# Patient Record
Sex: Female | Born: 1935 | Race: White | Hispanic: No | Smoking: Current some day smoker
Health system: Southern US, Community
[De-identification: ages and names within clinical notes are randomized; demographics above are authoritative.]

## PROBLEM LIST (undated history)

## (undated) DIAGNOSIS — E119 Type 2 diabetes mellitus without complications: Secondary | ICD-10-CM

## (undated) DIAGNOSIS — I5022 Chronic systolic (congestive) heart failure: Secondary | ICD-10-CM

## (undated) DIAGNOSIS — J449 Chronic obstructive pulmonary disease, unspecified: Secondary | ICD-10-CM

## (undated) DIAGNOSIS — I10 Essential (primary) hypertension: Secondary | ICD-10-CM

## (undated) DIAGNOSIS — E785 Hyperlipidemia, unspecified: Secondary | ICD-10-CM

## (undated) DIAGNOSIS — I4891 Unspecified atrial fibrillation: Secondary | ICD-10-CM

## (undated) DIAGNOSIS — Z8673 Personal history of transient ischemic attack (TIA), and cerebral infarction without residual deficits: Secondary | ICD-10-CM

## (undated) DIAGNOSIS — F0390 Unspecified dementia without behavioral disturbance: Secondary | ICD-10-CM

---

## 2001-03-23 ENCOUNTER — Encounter: Payer: Self-pay | Admitting: *Deleted

## 2001-03-23 ENCOUNTER — Ambulatory Visit (HOSPITAL_COMMUNITY): Admission: RE | Admit: 2001-03-23 | Discharge: 2001-03-23 | Payer: Self-pay | Admitting: *Deleted

## 2002-11-03 ENCOUNTER — Encounter: Payer: Self-pay | Admitting: Orthopaedic Surgery

## 2002-11-08 ENCOUNTER — Inpatient Hospital Stay (HOSPITAL_COMMUNITY): Admission: RE | Admit: 2002-11-08 | Discharge: 2002-11-10 | Payer: Self-pay | Admitting: Orthopaedic Surgery

## 2002-11-08 ENCOUNTER — Encounter: Payer: Self-pay | Admitting: Orthopaedic Surgery

## 2002-11-10 ENCOUNTER — Inpatient Hospital Stay (HOSPITAL_COMMUNITY)
Admission: RE | Admit: 2002-11-10 | Discharge: 2002-11-15 | Payer: Self-pay | Admitting: Physical Medicine & Rehabilitation

## 2003-12-10 ENCOUNTER — Encounter: Admission: RE | Admit: 2003-12-10 | Discharge: 2003-12-10 | Payer: Self-pay | Admitting: Orthopaedic Surgery

## 2004-02-14 ENCOUNTER — Ambulatory Visit: Payer: Self-pay | Admitting: Physical Medicine & Rehabilitation

## 2004-02-14 ENCOUNTER — Inpatient Hospital Stay (HOSPITAL_COMMUNITY): Admission: RE | Admit: 2004-02-14 | Discharge: 2004-02-18 | Payer: Self-pay | Admitting: Orthopaedic Surgery

## 2004-02-18 ENCOUNTER — Inpatient Hospital Stay
Admission: RE | Admit: 2004-02-18 | Discharge: 2004-02-22 | Payer: Self-pay | Admitting: Physical Medicine & Rehabilitation

## 2004-04-10 ENCOUNTER — Emergency Department (HOSPITAL_COMMUNITY): Admission: EM | Admit: 2004-04-10 | Discharge: 2004-04-10 | Payer: Self-pay | Admitting: Emergency Medicine

## 2004-08-07 ENCOUNTER — Ambulatory Visit (HOSPITAL_BASED_OUTPATIENT_CLINIC_OR_DEPARTMENT_OTHER): Admission: RE | Admit: 2004-08-07 | Discharge: 2004-08-07 | Payer: Self-pay | Admitting: General Surgery

## 2004-08-07 ENCOUNTER — Ambulatory Visit (HOSPITAL_COMMUNITY): Admission: RE | Admit: 2004-08-07 | Discharge: 2004-08-07 | Payer: Self-pay | Admitting: General Surgery

## 2006-01-22 ENCOUNTER — Ambulatory Visit (HOSPITAL_COMMUNITY): Admission: RE | Admit: 2006-01-22 | Discharge: 2006-01-22 | Payer: Self-pay | Admitting: Specialist

## 2006-01-31 ENCOUNTER — Ambulatory Visit: Payer: Self-pay | Admitting: Hospitalist

## 2006-01-31 ENCOUNTER — Observation Stay (HOSPITAL_COMMUNITY): Admission: EM | Admit: 2006-01-31 | Discharge: 2006-02-02 | Payer: Self-pay | Admitting: Emergency Medicine

## 2006-05-28 ENCOUNTER — Inpatient Hospital Stay (HOSPITAL_COMMUNITY): Admission: RE | Admit: 2006-05-28 | Discharge: 2006-06-02 | Payer: Self-pay | Admitting: Specialist

## 2006-05-31 ENCOUNTER — Ambulatory Visit: Payer: Self-pay | Admitting: Vascular Surgery

## 2006-05-31 ENCOUNTER — Ambulatory Visit: Payer: Self-pay | Admitting: Physical Medicine & Rehabilitation

## 2006-06-11 ENCOUNTER — Ambulatory Visit: Payer: Self-pay | Admitting: Vascular Surgery

## 2006-06-11 ENCOUNTER — Ambulatory Visit (HOSPITAL_COMMUNITY): Admission: RE | Admit: 2006-06-11 | Discharge: 2006-06-11 | Payer: Self-pay | Admitting: Specialist

## 2008-08-20 IMAGING — US US ABDOMEN COMPLETE
1 series · 14 of 25 positions shown · non-contrast
Comparison: none

CLINICAL DATA: Epigastric pain. Question AAA. 
 COMPLETE ABDOMINAL ULTRASOUND:

[Series 1: unknown · 0.33mm/px · 14 of 51 slices shown]
[im 1/51]
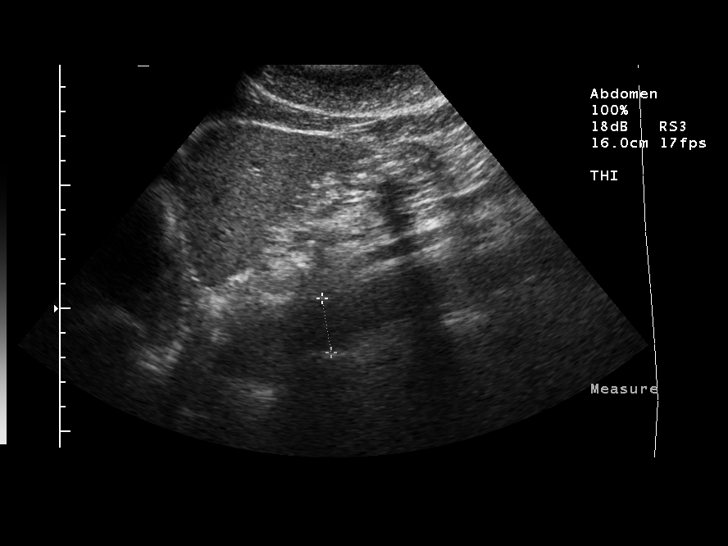
[im 5/51]
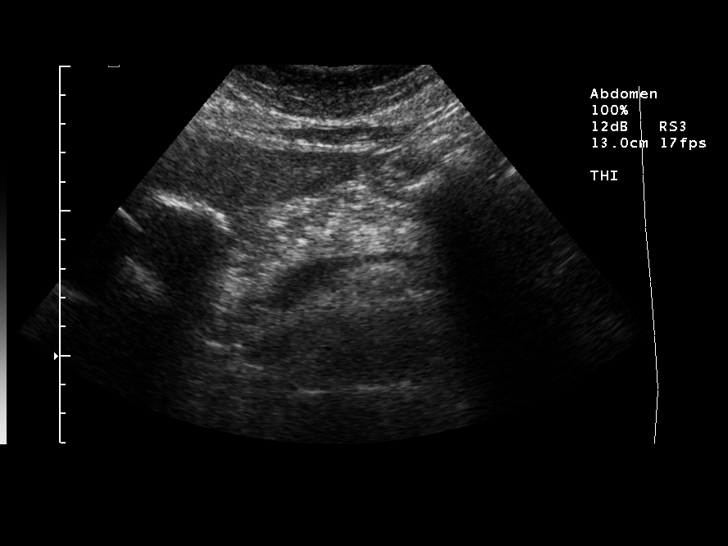
[im 9/51]
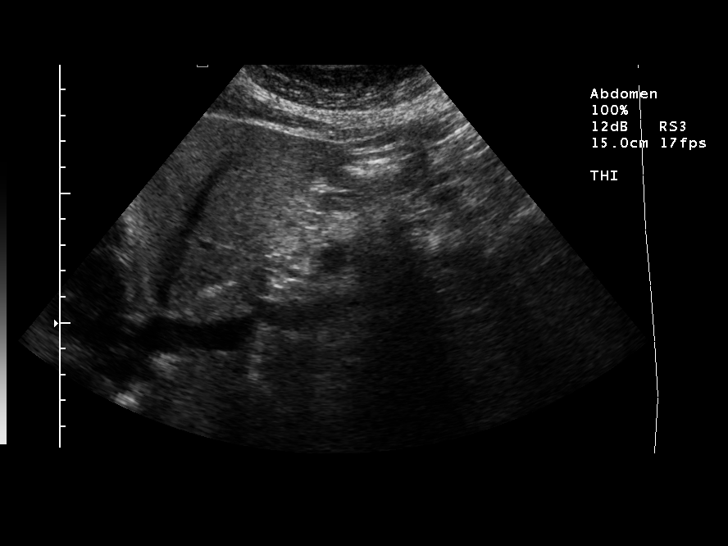
[im 13/51]
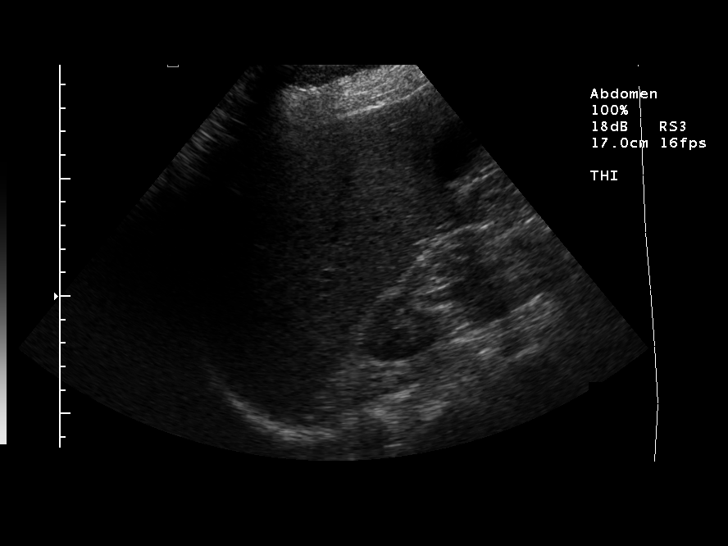
[im 17/51]
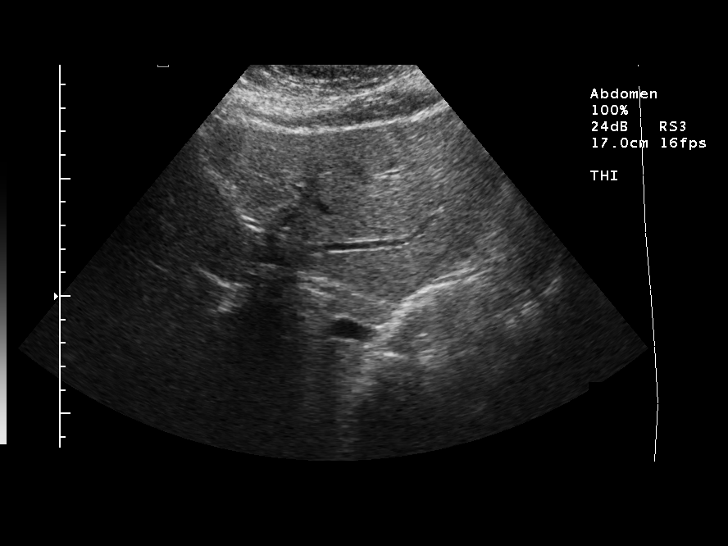
[im 19/51]
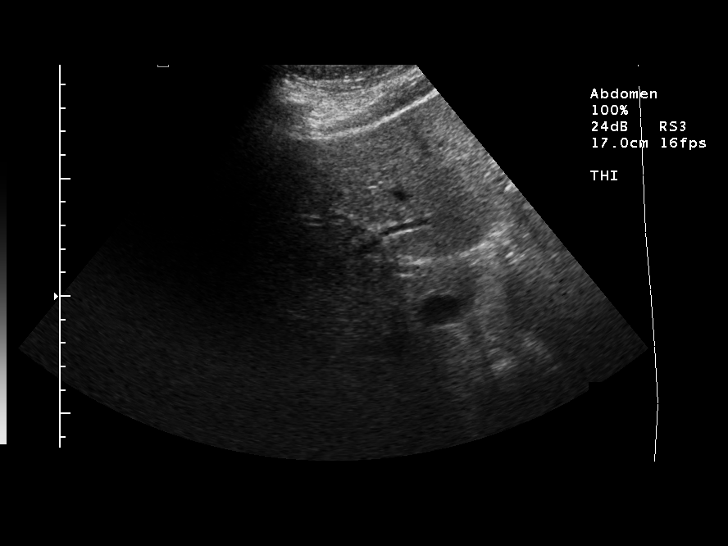
[im 23/51]
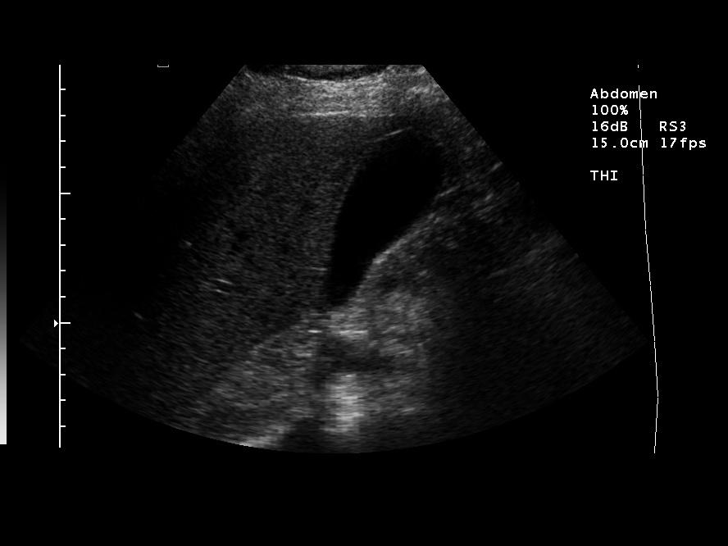
[im 28/51]
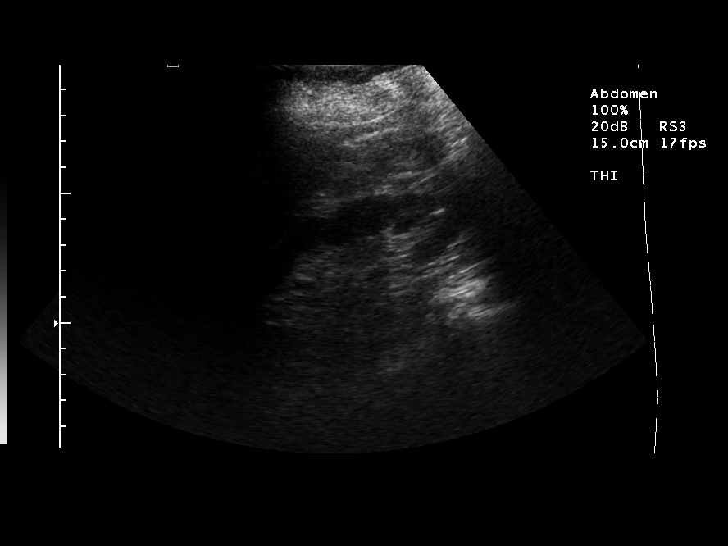
[im 32/51]
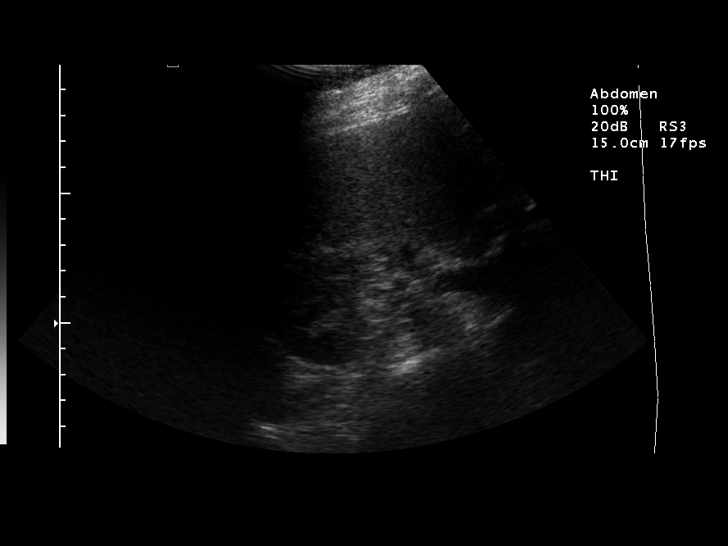
[im 34/51]
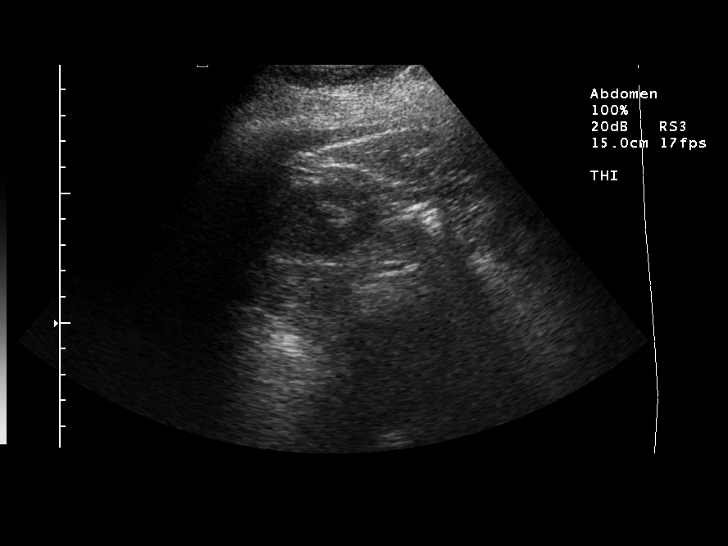
[im 38/51]
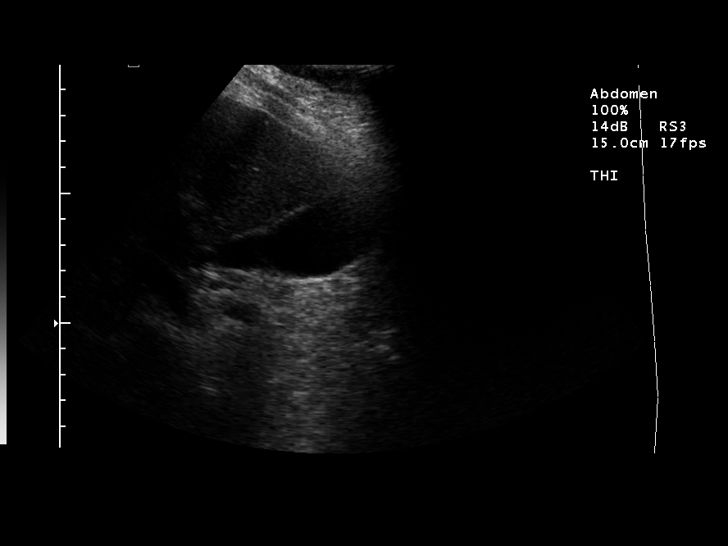
[im 42/51]
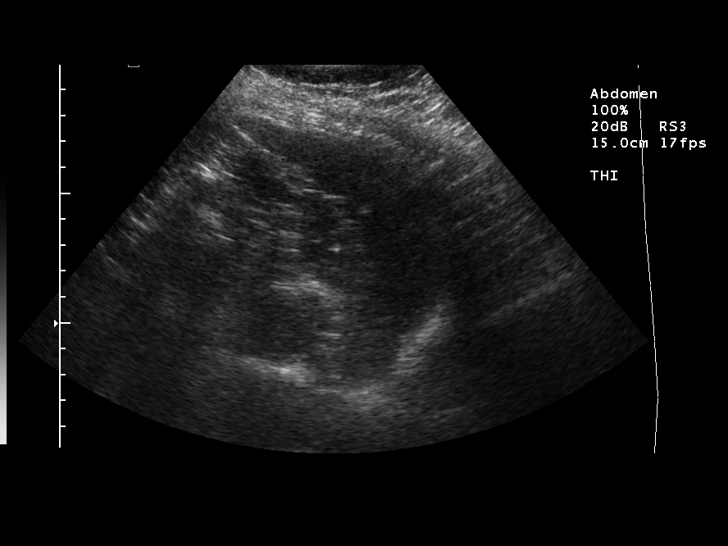
[im 46/51]
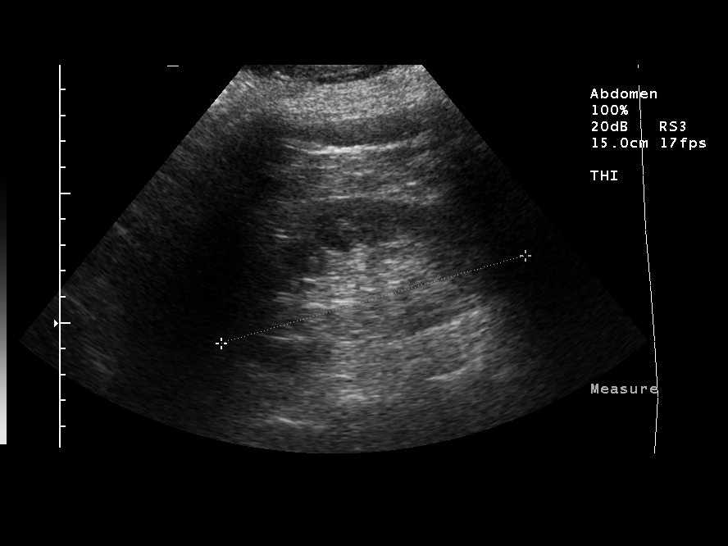
[im 51/51]
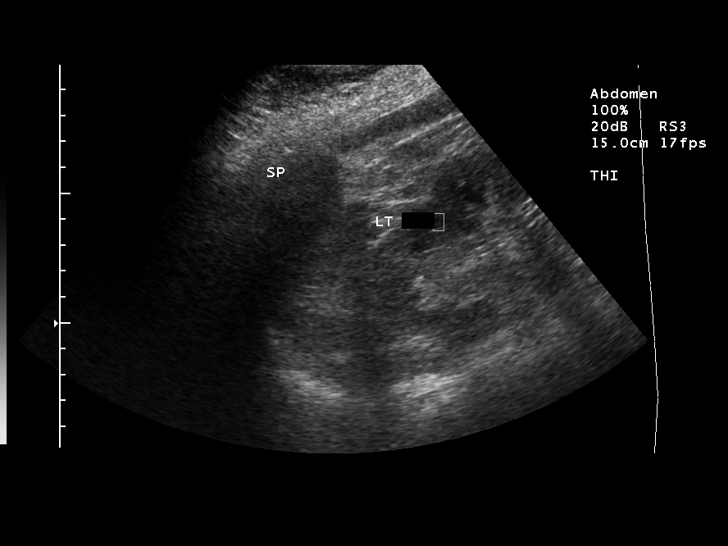

[14 of 25 positions shown; findings below may reference images not displayed]

FINDINGS: Normal gallbladder. Gallbladder wall thickness 1.3 mm.  No gallstones. The common duct measures 4.4 mm, which is well within normal limits. No hepatic, pancreatic, or splenic abnormalities. The spleen measures 8.7 cm in length. The right and left kidneys measure 13.2 and 12.2 cm in length, respectively.  The abdominal aorta measures 2.2 cm in maximum transverse diameter. No abdominal aortic aneurysm. Patent IVC.
IMPRESSION: No acute abdominal process. Normal gallbladder. Negative for AAA.

## 2012-01-02 ENCOUNTER — Encounter (HOSPITAL_COMMUNITY): Payer: Self-pay

## 2012-01-02 ENCOUNTER — Emergency Department (HOSPITAL_COMMUNITY): Payer: Medicare Other

## 2012-01-02 ENCOUNTER — Inpatient Hospital Stay (HOSPITAL_COMMUNITY)
Admission: EM | Admit: 2012-01-02 | Discharge: 2012-01-07 | DRG: 308 | Disposition: A | Discharge status: Skilled Nursing Facility | Payer: Medicare Other | Attending: Cardiology | Admitting: Cardiology

## 2012-01-02 DIAGNOSIS — R262 Difficulty in walking, not elsewhere classified: Secondary | ICD-10-CM

## 2012-01-02 DIAGNOSIS — Z8673 Personal history of transient ischemic attack (TIA), and cerebral infarction without residual deficits: Secondary | ICD-10-CM

## 2012-01-02 DIAGNOSIS — I4891 Unspecified atrial fibrillation: Principal | ICD-10-CM

## 2012-01-02 DIAGNOSIS — Z7982 Long term (current) use of aspirin: Secondary | ICD-10-CM

## 2012-01-02 DIAGNOSIS — J4489 Other specified chronic obstructive pulmonary disease: Secondary | ICD-10-CM | POA: Diagnosis present

## 2012-01-02 DIAGNOSIS — Z515 Encounter for palliative care: Secondary | ICD-10-CM

## 2012-01-02 DIAGNOSIS — Z66 Do not resuscitate: Secondary | ICD-10-CM | POA: Diagnosis present

## 2012-01-02 DIAGNOSIS — Z79899 Other long term (current) drug therapy: Secondary | ICD-10-CM

## 2012-01-02 DIAGNOSIS — I5043 Acute on chronic combined systolic (congestive) and diastolic (congestive) heart failure: Secondary | ICD-10-CM

## 2012-01-02 DIAGNOSIS — I672 Cerebral atherosclerosis: Secondary | ICD-10-CM | POA: Diagnosis present

## 2012-01-02 DIAGNOSIS — F0150 Vascular dementia without behavioral disturbance: Secondary | ICD-10-CM | POA: Diagnosis present

## 2012-01-02 DIAGNOSIS — M79673 Pain in unspecified foot: Secondary | ICD-10-CM

## 2012-01-02 DIAGNOSIS — J449 Chronic obstructive pulmonary disease, unspecified: Secondary | ICD-10-CM | POA: Diagnosis present

## 2012-01-02 DIAGNOSIS — E876 Hypokalemia: Secondary | ICD-10-CM | POA: Diagnosis present

## 2012-01-02 DIAGNOSIS — E785 Hyperlipidemia, unspecified: Secondary | ICD-10-CM

## 2012-01-02 DIAGNOSIS — R5381 Other malaise: Secondary | ICD-10-CM | POA: Diagnosis present

## 2012-01-02 DIAGNOSIS — E119 Type 2 diabetes mellitus without complications: Secondary | ICD-10-CM

## 2012-01-02 DIAGNOSIS — F0390 Unspecified dementia without behavioral disturbance: Secondary | ICD-10-CM

## 2012-01-02 DIAGNOSIS — Z9181 History of falling: Secondary | ICD-10-CM

## 2012-01-02 DIAGNOSIS — R296 Repeated falls: Secondary | ICD-10-CM

## 2012-01-02 DIAGNOSIS — I509 Heart failure, unspecified: Secondary | ICD-10-CM | POA: Diagnosis present

## 2012-01-02 DIAGNOSIS — I428 Other cardiomyopathies: Secondary | ICD-10-CM | POA: Diagnosis present

## 2012-01-02 DIAGNOSIS — E669 Obesity, unspecified: Secondary | ICD-10-CM | POA: Diagnosis present

## 2012-01-02 DIAGNOSIS — Z794 Long term (current) use of insulin: Secondary | ICD-10-CM

## 2012-01-02 DIAGNOSIS — I10 Essential (primary) hypertension: Secondary | ICD-10-CM

## 2012-01-02 DIAGNOSIS — I214 Non-ST elevation (NSTEMI) myocardial infarction: Secondary | ICD-10-CM

## 2012-01-02 HISTORY — DX: Essential (primary) hypertension: I10

## 2012-01-02 HISTORY — DX: Type 2 diabetes mellitus without complications: E11.9

## 2012-01-02 HISTORY — DX: Chronic obstructive pulmonary disease, unspecified: J44.9

## 2012-01-02 HISTORY — DX: Personal history of transient ischemic attack (TIA), and cerebral infarction without residual deficits: Z86.73

## 2012-01-02 HISTORY — DX: Chronic systolic (congestive) heart failure: I50.22

## 2012-01-02 HISTORY — DX: Unspecified dementia, unspecified severity, without behavioral disturbance, psychotic disturbance, mood disturbance, and anxiety: F03.90

## 2012-01-02 HISTORY — DX: Hyperlipidemia, unspecified: E78.5

## 2012-01-02 HISTORY — DX: Unspecified atrial fibrillation: I48.91

## 2012-01-02 LAB — POCT I-STAT, CHEM 8
Calcium, Ion: 1.14 mmol/L (ref 1.13–1.30)
Chloride: 103 mEq/L (ref 96–112)
Glucose, Bld: 135 mg/dL — ABNORMAL HIGH (ref 70–99)
HCT: 45 % (ref 36.0–46.0)
Potassium: 3.2 mEq/L — ABNORMAL LOW (ref 3.5–5.1)
Sodium: 140 mEq/L (ref 135–145)
TCO2: 23 mmol/L (ref 0–100)

## 2012-01-02 LAB — POCT I-STAT TROPONIN I

## 2012-01-02 LAB — CBC WITH DIFFERENTIAL/PLATELET
Basophils Absolute: 0 10*3/uL (ref 0.0–0.1)
Basophils Relative: 0 % (ref 0–1)
HCT: 42.5 % (ref 36.0–46.0)
Hemoglobin: 14.6 g/dL (ref 12.0–15.0)
Lymphocytes Relative: 22 % (ref 12–46)
MCH: 28.9 pg (ref 26.0–34.0)
MCV: 84 fL (ref 78.0–100.0)
Monocytes Relative: 15 % — ABNORMAL HIGH (ref 3–12)
Neutro Abs: 5.9 10*3/uL (ref 1.7–7.7)
Neutrophils Relative %: 62 % (ref 43–77)
Platelets: 261 10*3/uL (ref 150–400)
RDW: 14.5 % (ref 11.5–15.5)
WBC: 9.6 10*3/uL (ref 4.0–10.5)

## 2012-01-02 LAB — TROPONIN I: Troponin I: <0.3 ng/mL (ref <0.30)

## 2012-01-02 LAB — BASIC METABOLIC PANEL
BUN: 11 mg/dL (ref 6–23)
GFR calc Af Amer: >90 mL/min (ref >90)

## 2012-01-02 LAB — URINALYSIS, ROUTINE W REFLEX MICROSCOPIC
Bilirubin Urine: NEGATIVE
Ketones, ur: NEGATIVE mg/dL
Nitrite: NEGATIVE
pH: 6.5 (ref 5.0–8.0)

## 2012-01-02 LAB — URINE MICROSCOPIC-ADD ON

## 2012-01-02 LAB — HEPATIC FUNCTION PANEL
ALT: 23 U/L (ref 0–35)
Bilirubin, Direct: 0.3 mg/dL (ref 0.0–0.3)
Indirect Bilirubin: 1 mg/dL — ABNORMAL HIGH (ref 0.3–0.9)

## 2012-01-02 LAB — HEPARIN LEVEL (UNFRACTIONATED): Heparin Unfractionated: 0.32 IU/mL (ref 0.30–0.70)

## 2012-01-02 LAB — APTT: aPTT: 33 seconds (ref 24–37)

## 2012-01-02 LAB — GLUCOSE, CAPILLARY

## 2012-01-02 MED ORDER — ONDANSETRON HCL 4 MG/2ML IJ SOLN: 4.0000 mg | Freq: Four times a day (QID) | INTRAMUSCULAR | Status: DC | PRN

## 2012-01-02 MED ORDER — METOPROLOL TARTRATE 25 MG PO TABS
25.0000 mg | ORAL_TABLET | Freq: Once | ORAL | Status: AC
  Administered 2012-01-02: 25 mg via ORAL
  Filled 2012-01-02: qty 1

## 2012-01-02 MED ORDER — ASPIRIN EC 81 MG PO TBEC
81.0000 mg | DELAYED_RELEASE_TABLET | Freq: Every day | ORAL | Status: DC
  Administered 2012-01-03 – 2012-01-07 (×5): 81 mg via ORAL
  Filled 2012-01-02 (×5): qty 1

## 2012-01-02 MED ORDER — DIGOXIN 125 MCG PO TABS: 0.1250 mg | ORAL_TABLET | Freq: Every day | ORAL | Status: DC

## 2012-01-02 MED ORDER — DIGOXIN 250 MCG PO TABS
0.2500 mg | ORAL_TABLET | Freq: Every day | ORAL | Status: DC
  Administered 2012-01-02: 0.125 mg via ORAL
  Administered 2012-01-03 – 2012-01-07 (×5): 0.25 mg via ORAL
  Filled 2012-01-02 (×7): qty 1

## 2012-01-02 MED ORDER — LISINOPRIL 2.5 MG PO TABS
2.5000 mg | ORAL_TABLET | Freq: Every day | ORAL | Status: DC
Start: 2012-01-02 — End: 2012-01-07
  Administered 2012-01-02 – 2012-01-07 (×6): 2.5 mg via ORAL
  Filled 2012-01-02 (×6): qty 1

## 2012-01-02 MED ORDER — SODIUM CHLORIDE 0.9 % IJ SOLN
3.0000 mL | Freq: Two times a day (BID) | INTRAMUSCULAR | Status: DC
  Administered 2012-01-02 – 2012-01-07 (×11): 3 mL via INTRAVENOUS

## 2012-01-02 MED ORDER — DEXTROSE 50 % IV SOLN: 50.0000 mL | Freq: Once | INTRAVENOUS | Status: AC | PRN

## 2012-01-02 MED ORDER — HEPARIN (PORCINE) IN NACL 100-0.45 UNIT/ML-% IJ SOLN
1200.0000 [IU]/h | INTRAMUSCULAR | Status: DC
  Filled 2012-01-02 (×2): qty 250

## 2012-01-02 MED ORDER — FUROSEMIDE 10 MG/ML IJ SOLN
20.0000 mg | Freq: Once | INTRAMUSCULAR | Status: AC
  Administered 2012-01-02: 20 mg via INTRAVENOUS
  Filled 2012-01-02: qty 2

## 2012-01-02 MED ORDER — DILTIAZEM HCL 100 MG IV SOLR
5.0000 mg/h | Freq: Once | INTRAVENOUS | Status: AC
  Administered 2012-01-02: 5 mg/h via INTRAVENOUS

## 2012-01-02 MED ORDER — DILTIAZEM HCL 100 MG IV SOLR
5.0000 mg/h | INTRAVENOUS | Status: DC
  Administered 2012-01-02 – 2012-01-03 (×2): 5 mg/h via INTRAVENOUS
  Filled 2012-01-02: qty 100

## 2012-01-02 MED ORDER — ASPIRIN 81 MG PO CHEW
324.0000 mg | CHEWABLE_TABLET | Freq: Once | ORAL | Status: AC
  Administered 2012-01-02: 324 mg via ORAL
  Filled 2012-01-02: qty 4

## 2012-01-02 MED ORDER — GLUCOSE 40 % PO GEL: 1.0000 | ORAL | Status: DC | PRN

## 2012-01-02 MED ORDER — NITROGLYCERIN 0.4 MG SL SUBL: 0.4000 mg | SUBLINGUAL_TABLET | SUBLINGUAL | Status: DC | PRN

## 2012-01-02 MED ORDER — INSULIN GLARGINE 100 UNIT/ML ~~LOC~~ SOLN
40.0000 [IU] | Freq: Every day | SUBCUTANEOUS | Status: DC
  Administered 2012-01-02: 100 [IU] via SUBCUTANEOUS
  Administered 2012-01-02 – 2012-01-04 (×3): 40 [IU] via SUBCUTANEOUS
  Filled 2012-01-02: qty 1

## 2012-01-02 MED ORDER — POTASSIUM CHLORIDE CRYS ER 20 MEQ PO TBCR
40.0000 meq | EXTENDED_RELEASE_TABLET | Freq: Once | ORAL | Status: AC
  Administered 2012-01-02: 20 meq via ORAL

## 2012-01-02 MED ORDER — PANTOPRAZOLE SODIUM 40 MG PO TBEC
40.0000 mg | DELAYED_RELEASE_TABLET | Freq: Every day | ORAL | Status: DC
  Administered 2012-01-02 – 2012-01-07 (×6): 40 mg via ORAL
  Filled 2012-01-02 (×6): qty 1

## 2012-01-02 MED ORDER — METOPROLOL TARTRATE 50 MG PO TABS
50.0000 mg | ORAL_TABLET | Freq: Two times a day (BID) | ORAL | Status: DC
  Administered 2012-01-02 – 2012-01-04 (×5): 50 mg via ORAL
  Filled 2012-01-02 (×5): qty 1
  Filled 2012-01-02: qty 2
  Filled 2012-01-02: qty 1

## 2012-01-02 MED ORDER — ATORVASTATIN CALCIUM 20 MG PO TABS
20.0000 mg | ORAL_TABLET | Freq: Every day | ORAL | Status: DC
  Administered 2012-01-02 (×2): 20 mg via ORAL
  Filled 2012-01-02 (×4): qty 1

## 2012-01-02 MED ORDER — POTASSIUM CHLORIDE CRYS ER 20 MEQ PO TBCR
EXTENDED_RELEASE_TABLET | ORAL | Status: AC
  Filled 2012-01-02: qty 2

## 2012-01-02 MED ORDER — INSULIN ASPART 100 UNIT/ML ~~LOC~~ SOLN
0.0000 [IU] | Freq: Three times a day (TID) | SUBCUTANEOUS | Status: DC
  Administered 2012-01-02 (×2): 3 [IU] via SUBCUTANEOUS
  Administered 2012-01-03: 2 [IU] via SUBCUTANEOUS
  Administered 2012-01-03: 5 [IU] via SUBCUTANEOUS
  Administered 2012-01-04: 11 [IU] via SUBCUTANEOUS
  Administered 2012-01-04: 5 [IU] via SUBCUTANEOUS
  Administered 2012-01-04 – 2012-01-05 (×2): 3 [IU] via SUBCUTANEOUS
  Administered 2012-01-05: 5 [IU] via SUBCUTANEOUS
  Administered 2012-01-05 – 2012-01-07 (×4): 3 [IU] via SUBCUTANEOUS
  Administered 2012-01-07: 8 [IU] via SUBCUTANEOUS

## 2012-01-02 MED ORDER — ROPINIROLE HCL 1 MG PO TABS
2.0000 mg | ORAL_TABLET | Freq: Every day | ORAL | Status: DC
  Administered 2012-01-02 – 2012-01-06 (×5): 2 mg via ORAL
  Filled 2012-01-02 (×7): qty 2

## 2012-01-02 MED ORDER — ACETAMINOPHEN 325 MG PO TABS: 650.0000 mg | ORAL_TABLET | ORAL | Status: DC | PRN

## 2012-01-02 MED ORDER — FUROSEMIDE 40 MG PO TABS
40.0000 mg | ORAL_TABLET | Freq: Two times a day (BID) | ORAL | Status: DC
  Administered 2012-01-02 – 2012-01-03 (×3): 40 mg via ORAL
  Filled 2012-01-02: qty 1
  Filled 2012-01-02: qty 2
  Filled 2012-01-02 (×3): qty 1

## 2012-01-02 MED ORDER — HEPARIN (PORCINE) IN NACL 100-0.45 UNIT/ML-% IJ SOLN
12.0000 [IU]/kg/h | INTRAMUSCULAR | Status: DC
  Administered 2012-01-02 (×2): 12 [IU]/kg/h via INTRAVENOUS
  Filled 2012-01-02 (×3): qty 250

## 2012-01-02 MED ORDER — DILTIAZEM HCL 25 MG/5ML IV SOLN
20.0000 mg | Freq: Once | INTRAVENOUS | Status: AC
  Administered 2012-01-02: 20 mg via INTRAVENOUS
  Filled 2012-01-02: qty 5

## 2012-01-02 MED ORDER — GLUCOSE-VITAMIN C 4-6 GM-MG PO CHEW: 4.0000 | CHEWABLE_TABLET | ORAL | Status: DC | PRN

## 2012-01-02 MED ORDER — HEPARIN BOLUS VIA INFUSION
4000.0000 [IU] | Freq: Once | INTRAVENOUS | Status: AC
  Administered 2012-01-02: 4000 [IU] via INTRAVENOUS

## 2012-01-02 MED ORDER — SODIUM CHLORIDE 0.9 % IV SOLN
Freq: Once | INTRAVENOUS | Status: AC
  Administered 2012-01-02: 03:00:00 via INTRAVENOUS

## 2012-01-02 NOTE — ED Notes (Signed)
Pt from [place] nursing facility. Staff at facility stated pt looked "grey" and had SOB and accessory muscle use. Reported pt seen at [facility] med center and dx with bilateral pleural effusions. Pt. Denies SOB or difficulty breathing. Per EMS, pt 98% on 2 L O2, no SOB noted.

## 2012-01-02 NOTE — ED Provider Notes (Signed)
History     CSN: [id number]  Arrival date & time 01/02/12  0145   First MD Initiated Contact with Patient 01/02/12 0154      Chief Complaint  Patient presents with  . Shortness of Breath    (Consider location/radiation/quality/duration/timing/severity/associated sxs/prior treatment) HPI Comments: 77 year old female with a history of dementia presents with a complaint of shortness of breath. According to the staff at the nursing facility where the patient lives she was found to look great and using accessory muscle use with a tachycardia.  The patient is unsure what medication she is on and she does admit to having a blood clot in the past. She does admit to having some shortness of breath, states that her heart is always running fast and denies any swelling. She has evidence of injuries to her right arm and right leg and states that she falls frequently.  Patient is a 77 y.o. female presenting with shortness of breath. The history is provided by the patient, the nursing home and the EMS personnel. The history is limited by the absence of a caregiver (Level V caveat apply secondary to dementia).  Shortness of Breath  Associated symptoms include shortness of breath.    Past Medical History  Diagnosis Date  . COPD (chronic obstructive pulmonary disease)   . Diabetes mellitus without complication   . Hypertension   . Dementia   . A-fib     History reviewed. No pertinent past surgical history.  No family history on file.  History  Substance Use Topics  . Smoking status: Not on file  . Smokeless tobacco: Not on file  . Alcohol Use:     OB History    Grav Para Term Preterm Abortions TAB SAB Ect Mult Living                  Review of Systems  Unable to perform ROS: Dementia  Respiratory: Positive for shortness of breath.     Allergies  Celebrex and Lyrica  Home Medications   Current Outpatient Rx  Name  Route  Sig  Dispense  Refill  . ASPIRIN 325 MG PO TBEC  Oral   Take 325 mg by mouth daily.         . ATORVASTATIN CALCIUM 20 MG PO TABS   Oral   Take 20 mg by mouth daily at 8 pm.         . DIGOXIN 0.125 MG PO TABS   Oral   Take 0.125 mg by mouth daily.         Marland Kitchen DILTIAZEM HCL 30 MG PO TABS   Oral   Take 30 mg by mouth 3 (three) times daily.         Marland Kitchen ESOMEPRAZOLE MAGNESIUM 40 MG PO CPDR   Oral   Take 40 mg by mouth daily before breakfast.         . INSULIN GLARGINE 100 UNIT/ML [place] SOLN   Subcutaneous   Inject 40 Units into the skin daily at 8 pm.         . METOPROLOL TARTRATE 50 MG PO TABS   Oral   Take 50 mg by mouth 2 (two) times daily.         Marland Kitchen NITROGLYCERIN 0.4 MG SL SUBL   Sublingual   Place 0.4 mg under the tongue every 5 (five) minutes as needed. Chest pain         . OXYCODONE HCL 15 MG PO TABS   Oral  Take 15 mg by mouth every 8 (eight) hours as needed. pain         . ROPINIROLE HCL 2 MG PO TABS   Oral   Take 2 mg by mouth at bedtime.           BP 135/86  Pulse 95  Temp 97.5 F (36.4 C) (Oral)  Resp 22  Ht 5\' 7"  (1.702 m)  Wt 198 lb 10.2 oz (90.101 kg)  BMI 31.11 kg/m2  SpO2 96%  Physical Exam  Nursing note and vitals reviewed. Constitutional: She appears well-developed and well-nourished. No distress.  HENT:  Head: Normocephalic and atraumatic.  Mouth/Throat: Oropharynx is clear and moist. No oropharyngeal exudate.  Eyes: Conjunctivae normal and EOM are normal. Pupils are equal, round, and reactive to light. Right eye exhibits no discharge. Left eye exhibits no discharge. No scleral icterus.  Neck: Normal range of motion. Neck supple. No JVD present. No thyromegaly present.  Cardiovascular: Normal heart sounds and intact distal pulses.  Exam reveals no gallop and no friction rub.   No murmur heard.      Atrial fibrillation with a rate of 130-150  Pulmonary/Chest: Effort normal and breath sounds normal. No respiratory distress. She has no wheezes. She has no rales.       Overall  the lungs are clear bilaterally, respiratory rate of 18  Abdominal: Soft. Bowel sounds are normal. She exhibits no distension and no mass. There is no tenderness.  Musculoskeletal: Normal range of motion. She exhibits no edema and no tenderness.  Lymphadenopathy:    She has no cervical adenopathy.  Neurological: She is alert. Coordination normal.  Skin: Skin is warm and dry. No rash noted. No erythema.       Skin tears to the right distal forearm and right distal lower extremity, no bleeding, no swelling, redness  Psychiatric: She has a normal mood and affect. Her behavior is normal.    ED Course  Procedures (including critical care time)  Labs Reviewed  BASIC METABOLIC PANEL - Abnormal; Notable for the following:    Potassium 3.2 (*)     Glucose, Bld 139 (*)     GFR calc non Af Amer 83 (*)     All other components within normal limits  CBC WITH DIFFERENTIAL - Abnormal; Notable for the following:    Monocytes Relative 15 (*)     Monocytes Absolute 1.4 (*)     All other components within normal limits  PROTIME-INR - Abnormal; Notable for the following:    Prothrombin Time 17.4 (*)     All other components within normal limits  PRO B NATRIURETIC PEPTIDE - Abnormal; Notable for the following:    Pro B Natriuretic peptide (BNP) 6535.0 (*)     All other components within normal limits  DIGOXIN LEVEL - Abnormal; Notable for the following:    Digoxin Level 0.4 (*)     All other components within normal limits  POCT I-STAT, CHEM 8 - Abnormal; Notable for the following:    Potassium 3.2 (*)     Glucose, Bld 135 (*)     Hemoglobin 15.3 (*)     All other components within normal limits  POCT I-STAT TROPONIN I - Abnormal; Notable for the following:    Troponin i, poc 0.32 (*)     All other components within normal limits  APTT   Dg Chest Port 1 View  01/02/2012  *RADIOLOGY REPORT*  Clinical Data: Shortness of breath, atrial fibrillation  PORTABLE  CHEST - 1 VIEW  Comparison: None.   Findings: Cardiomegaly.  Central vascular congestion.  Bibasilar and infrahilar opacities.  Layering pleural effusions.  No pneumothorax.  Aortic atherosclerosis.  No acute osseous finding. Partially imaged lumbar fusion hardware.  IMPRESSION: Cardiomegaly with central vascular congestion. Infrahilar opacities are most in keeping with mild pulmonary edema.  Small pleural effusions and compressive atelectasis.   Original Report Authenticated By: Jearld Lesch, M.D.      1. Atrial fibrillation with rapid ventricular response   2. NSTEMI (non-ST elevated myocardial infarction)       MDM  Healing wounds with dressings applied, patient is in atrial fibrillation with a rapid ventricular rate, her EKG appears consistent with this with a rate of 145 and no obvious ischemia. We'll obtain laboratory data to rule out sources of atrial fibrillation including myocardial infarction, brain natruretic peptide and a chest x-ray. The patient will report her Cardizem and possibly a drip.  ED ECG REPORT  I personally interpreted this EKG   Date: 01/02/2012   Rate: 145  Rhythm: atrial fibrillation and With a rapid ventricular rate  QRS Axis: normal  Intervals: normal  ST/T Wave abnormalities: nonspecific T wave changes  Conduction Disutrbances:none  Narrative Interpretation:   Old EKG Reviewed: Compared with EKG from April 2008, atrial fibrillation who presents in place with normal sinus rhythm.   dw Cardiologist with [facility] - Heparin ordered, has positive troponin and afib requiring anticoagulation.  CC provided.    Admit  CRITICAL CARE Performed by: Vida Roller   Total critical care time: 35  Critical care time was exclusive of separately billable procedures and treating other patients.  Critical care was necessary to treat or prevent imminent or life-threatening deterioration.  Critical care was time spent personally by me on the following activities: development of treatment plan with  patient and/or surrogate as well as nursing, discussions with consultants, evaluation of patient's response to treatment, examination of patient, obtaining history from patient or surrogate, ordering and performing treatments and interventions, ordering and review of laboratory studies, ordering and review of radiographic studies, pulse oximetry and re-evaluation of patient's condition.       Vida Roller, MD 01/02/12 [phone]

## 2012-01-02 NOTE — ED Notes (Signed)
Pt. Has multiple bruises all over body. See wound documentation for other wounds noted.

## 2012-01-02 NOTE — Progress Notes (Signed)
ANTICOAGULATION CONSULT NOTE - Initial Consult  Pharmacy Consult for Heparin Indication: Afib/NSTEMI  Allergies  Allergen Reactions  . Celebrex (Celecoxib) Other (See Comments)    Per [facility]  . Lyrica (Pregabalin) Other (See Comments)    Per [facility]    Patient Measurements: Height: 5\' 7"  (170.2 cm) Weight: 194 lb 0.1 oz (88 kg) IBW/kg (Calculated) : 61.6  Heparin Dosing Weight: 80 kg   Vital Signs: Temp: 98.5 F (36.9 C) (11/16 1629) Temp src: Oral (11/16 1629) BP: 135/83 mmHg (11/16 1520) Pulse Rate: 85  (11/16 1520)  Labs:  Basename 01/02/12 1507 01/02/12 0806 01/02/12 0224 01/02/12 0214  HGB -- -- 15.3* 14.6  HCT -- -- 45.0 42.5  PLT -- -- -- 261  APTT -- -- -- 33  LABPROT -- -- -- 17.4*  INR -- -- -- 1.47  HEPARINUNFRC 0.51 -- -- --  CREATININE -- -- 0.90 0.72  CKTOTAL -- -- -- --  CKMB -- -- -- --  TROPONINI <0.30 <0.30 -- --    Estimated Creatinine Clearance: 62.5 ml/min (by C-G formula based on Cr of 0.9).   Medical History: Past Medical History  Diagnosis Date  . COPD (chronic obstructive pulmonary disease)   . Diabetes mellitus without complication   . Hypertension   . Dementia   . A-fib     Medications:  ASA  Lipitor  Digoxin  Cardizem  Nexium  Lantus  Lopressor  Ntg  OxyIR  Requip    Assessment: 77 yo female with Afib/ACS for Heparin.  First heparin level therapeutic, will continue same rate. No bleeding reported.  Goal of Therapy:  Heparin level 0.3-0.7 units/ml Monitor platelets by anticoagulation protocol: Yes   Plan:  Continue Heparin at current rate Check heparin level in 6 hours.  Verlene Mayer, PharmD, BCPS Pager [phone] 01/02/2012,4:38 PM

## 2012-01-02 NOTE — ED Notes (Signed)
Walked into pt room. Pt. Had taken off BP, 02 sat, and oxygen. BP and 02 sat reapplied. Pt. Maintaining 02 sat >93% on RA

## 2012-01-02 NOTE — ED Notes (Signed)
Patient transferred to 3302 via stretcher report given to [facility]

## 2012-01-02 NOTE — ED Notes (Signed)
Resting with eyes closed appears to be sleeping 

## 2012-01-02 NOTE — ED Notes (Signed)
Patient remains very confused. She pull BP cuff all leads and O2 sat monitor off, all were re-established.

## 2012-01-02 NOTE — Progress Notes (Signed)
Spoke with Jasmine Newcomer PA -- pt's son is at bedside and said pt has DNR order at facility. Plan of care -- son will bring in DNR form and HCPOA papers. Palliative care consult put in place for goals of care based on son's statement that his mother would not want a lot of interventions. Renette Butters, Viona Gilmore

## 2012-01-02 NOTE — Progress Notes (Signed)
Spoke with Tereso Newcomer PA and obtained orders for Cardizem drip and foley catheter. Also discussed that pt's bilateral pedal pulses are faint but palpable. Cap refill is slightly sluggish--no evidence of ulcers and pt says she's not having any pain, numbness or tingling. Renette Butters, Viona Gilmore

## 2012-01-02 NOTE — ED Notes (Signed)
Patient had voided in her depends a large amount, also had spilled food from her breakfast tray onto her gown and bed. Patient was cleaned up

## 2012-01-02 NOTE — H&P (Signed)
Patient ID: BREIGH ANNETT MRN: [id number], DOB/AGE: [birth year]   Admit date: 01/02/2012   Primary Physician: Dr. Evlyn Kanner Primary Cardiologist: none CONTACT: Rhen Dossantos (son); [phone]   Problem List  Past Medical History  Diagnosis Date  . COPD (chronic obstructive pulmonary disease)   . Diabetes mellitus without complication   . Hypertension   . Dementia   . A-fib     History reviewed. No pertinent past surgical history.   Allergies  Allergies  Allergen Reactions  . Celebrex (Celecoxib) Other (See Comments)    Per [facility]  . Lyrica (Pregabalin) Other (See Comments)    Per [facility]    HPI  77 y/o female presents from [facility] with h/o AFib, CHF, Cardiomyopathy, Dementia, CVA, DM, HTN, HLD who was sent in from her NH after being found tachypneic, ashen, and tachycardic with a sat of 86% on room air.  In the ER she was found to be in A Fib with RVR with a small troponin bump.  She was given UFH bolus/gtt, O2, ASA 325, and Lasix 20 mg IV.  She was also placed on a Dilt gtt at 15 mg/hr.  Her HR subsequently came down to the 70-80s.  She was recently admitted to [facility] for A Fib with RVR, UTI, and NSTEMI, treated medically with po Dilt, po Metop, po Dig, and a heparin gtt.  An echo was done there which showed a normal size LV with an EF 20-25% (global), mildly reduced RV function, mod LAE, and mild MR/TR.  She was not sent out on [facility] due to a h/o falls.  Currently the patient has no complaints.  She does not know why she is here.  She denies dyspnea, chest pain, palpitations, PND, edema, orthopnea, syncope.  She denies known h/o cardiac surgery or cath; admits to knowing of irregular heart beat  Home Medications  Prior to Admission medications   Medication Sig Start Date End Date Taking? Authorizing Provider  aspirin 325 MG EC tablet Take 325 mg by mouth daily.   Yes Historical Provider, MD  atorvastatin (LIPITOR)  20 MG tablet Take 20 mg by mouth daily at 8 pm.   Yes Historical Provider, MD  digoxin (LANOXIN) 0.125 MG tablet Take 0.125 mg by mouth daily.   Yes Historical Provider, MD  diltiazem (CARDIZEM) 30 MG tablet Take 30 mg by mouth 3 (three) times daily.   Yes Historical Provider, MD  esomeprazole (NEXIUM) 40 MG capsule Take 40 mg by mouth daily before breakfast.   Yes Historical Provider, MD  insulin glargine (LANTUS) 100 UNIT/ML injection Inject 40 Units into the skin daily at 8 pm.   Yes Historical Provider, MD  metoprolol (LOPRESSOR) 50 MG tablet Take 50 mg by mouth 2 (two) times daily.   Yes Historical Provider, MD  nitroGLYCERIN (NITROSTAT) 0.4 MG SL tablet Place 0.4 mg under the tongue every 5 (five) minutes as needed. Chest pain   Yes Historical Provider, MD  oxyCODONE (ROXICODONE) 15 MG immediate release tablet Take 15 mg by mouth every 8 (eight) hours as needed. pain   Yes Historical Provider, MD  rOPINIRole (REQUIP) 2 MG tablet Take 2 mg by mouth at bedtime.   Yes Historical Provider, MD    Family History  Unable to obtain due to dementia  Social History Lives at [facility]  CONTACT: Novella Abraha (son); [phone] History   Social History  . Marital Status: Widowed  Spouse Name: N/A    Number of Children: N/A  . Years of Education: N/A   Occupational History  . Not on file.   Social History Main Topics  . Smoking status: Not on file  . Smokeless tobacco: Not on file  . Alcohol Use:   . Drug Use:   . Sexually Active:    Other Topics Concern  . Not on file   Social History Narrative  . No narrative on file     Review of Systems  12 point ROS reviewed and negative or as HPI  Physical Exam  Blood pressure 129/78, pulse 65, temperature 97.5 F (36.4 C), temperature source Oral, resp. rate 27, height 5\' 7"  (1.702 m), weight 198 lb 10.2 oz (90.101 kg), SpO2 100.00%.  General: Pleasant, NAD Psych: calm Neuro: Alert, not oriented to  place/time (thinks it is January in the 1960s); MAE HEENT: pupils small and symmetrically reactive; MMM, atraumatic Neck: Supple without bruits; JVD to mandible Lungs:  Resp regular and unlabored, CTA. Heart: irregular, no murmurs, no S3/S4 Abdomen: Soft, non-tender, non-distended, BS + x 4.  Extremities: warm, no edema. Radials 2+, left DP 2+, right DP 0, PTs 0 SKIN: diffuse SKs, dry skin; scattered ecchymoses  Labs  BNP 6535 TnI 0.32  Lab Results  Component Value Date   WBC 9.6 01/02/2012   HGB 15.3* 01/02/2012   HCT 45.0 01/02/2012   MCV 84.0 01/02/2012   PLT 261 01/02/2012     Lab 01/02/12 0224 01/02/12 0214  NA 140 --  K 3.2* --  CL 103 --  CO2 -- 25  BUN 9 --  CREATININE 0.90 --  CALCIUM -- 8.6  PROT -- --  BILITOT -- --  ALKPHOS -- --  ALT -- --  AST -- --  GLUCOSE 135* --     Radiology/Studies  Dg Chest Port 1 View  01/02/2012  *RADIOLOGY REPORT*  Clinical Data: Shortness of breath, atrial fibrillation  PORTABLE CHEST - 1 VIEW  Comparison: None.  Findings: Cardiomegaly.  Central vascular congestion.  Bibasilar and infrahilar opacities.  Layering pleural effusions.  No pneumothorax.  Aortic atherosclerosis.  No acute osseous finding. Partially imaged lumbar fusion hardware.  IMPRESSION: Cardiomegaly with central vascular congestion. Infrahilar opacities are most in keeping with mild pulmonary edema.  Small pleural effusions and compressive atelectasis.   Original Report Authenticated By: Jearld Lesch, M.D.     ECG  A Fib with RVR, diffuse T-wave flattening; similar to 12/29/11 ECGs done elsewhere  ASSESSMENT AND PLAN  A Fib with RVR- UFH gtt for now, check TSH; defer TTE as one was just done during her last admit and we have the report; as she is not on [facility] right now, will have to attempt rate-control; will try to get Dilt off given low EF; will increase Metop gradually (50 mg bid ordered and will give extra 25 mg now dose); will increase Digoxin to 250  mcg daily for now (normal GFR); may have to discuss with son benefits/risks of AC, as her CHADS-VASc score is 7; she may be a good candidate for once daily Xarelto  NSTEMI- unclear if troponin downtrending from last admission or new; no ECG changes of large MI; no CP; will trend for now; continue aspirin, lipitor, metop; on UFH gtt as above; try to manage medically  Acute on chronic biventricular systolic CHF/cardiomyopathy with mild MR on last TTE- unclear if due to A Fib or other cause; no ischemia eval that I know of;  try to manage medically given dementia; will continue metop as above, advise consolidation to Toprol XL at discharge; will start Lisinopril 2.5 mg daily and titrate up as able; she was on 5 mg daily at her last d/c but it is not listed on her NH flowsheet for some reason; strict I/Os, daily weights; Lasix 40 mg bid ordered starting this afternoon as she got 20 mg IV in ER; hold on Spirono, ICD for now  Hypokalemia- replete, follow daily HTN- continue Metop, Lisinopril as above HLD- on lipitor DM- home lantus and SSI ordered Dementia, NOS H/o CVA, details unknown- see A Fib discussion Dispo- PT/OT/SW consults placed  Full Code for now as no other info available; maybe d/w son later today   Signed, Fanta Wimberley, MD 01/02/2012, 6:07 AM

## 2012-01-02 NOTE — Progress Notes (Signed)
ANTICOAGULATION CONSULT NOTE - Initial Consult  Pharmacy Consult for Heparin Indication: Afib/ACS  Allergies  Allergen Reactions  . Celebrex (Celecoxib) Other (See Comments)    Per [facility]  . Lyrica (Pregabalin) Other (See Comments)    Per [facility]    Patient Measurements: Height: 5\' 7"  (170.2 cm) Weight: 198 lb 10.2 oz (90.101 kg) IBW/kg (Calculated) : 61.6  Heparin Dosing Weight: 80 kg   Vital Signs: Temp: 97.5 F (36.4 C) (11/16 0153) Temp src: Oral (11/16 0153) BP: 124/109 mmHg (11/16 0623) Pulse Rate: 108  (11/16 0623)  Labs:  Basename 01/02/12 0224 01/02/12 0214  HGB 15.3* 14.6  HCT 45.0 42.5  PLT -- 261  APTT -- 33  LABPROT -- 17.4*  INR -- 1.47  HEPARINUNFRC -- --  CREATININE 0.90 0.72  CKTOTAL -- --  CKMB -- --  TROPONINI -- --    Estimated Creatinine Clearance: 63.2 ml/min (by C-G formula based on Cr of 0.9).   Medical History: Past Medical History  Diagnosis Date  . COPD (chronic obstructive pulmonary disease)   . Diabetes mellitus without complication   . Hypertension   . Dementia   . A-fib     Medications:  ASA  Lipitor  Digoxin  Cardizem  Nexium  Lantus  Lopressor  Ntg  OxyIR  Requip    Assessment: 77 yo female with Afib/ACS for Heparin.  Heparin 4000 units IV bolus, 1100 units/hr started in ED at 0530   Goal of Therapy:  Heparin level 0.3-0.7 units/ml Monitor platelets by anticoagulation protocol: Yes   Plan:  Continue Heparin at current rate Check heparin level in 6 hours.  Eddie Candle 01/02/2012,7:01 AM

## 2012-01-02 NOTE — Progress Notes (Signed)
ANTICOAGULATION CONSULT NOTE - Follow Up Consult  Pharmacy Consult for Heparin Indication: Afib/NSTEMI  Allergies  Allergen Reactions  . Celebrex (Celecoxib) Other (See Comments)    Per [facility]  . Lyrica (Pregabalin) Other (See Comments)    Per [facility]    Patient Measurements: Height: 5\' 7"  (170.2 cm) Weight: 194 lb 0.1 oz (88 kg) IBW/kg (Calculated) : 61.6  Heparin Dosing Weight: 80 kg   Vital Signs: Temp: 98.2 F (36.8 C) (11/16 1956) Temp src: Oral (11/16 1956) BP: 120/66 mmHg (11/16 1956) Pulse Rate: 78  (11/16 1956)  Labs:  Basename 01/02/12 2146 01/02/12 1507 01/02/12 0806 01/02/12 0224 01/02/12 0214  HGB -- -- -- 15.3* 14.6  HCT -- -- -- 45.0 42.5  PLT -- -- -- -- 261  APTT -- -- -- -- 33  LABPROT -- -- -- -- 17.4*  INR -- -- -- -- 1.47  HEPARINUNFRC 0.32 0.51 -- -- --  CREATININE -- -- -- 0.90 0.72  CKTOTAL -- -- -- -- --  CKMB -- -- -- -- --  TROPONINI -- <0.30 <0.30 -- --    Estimated Creatinine Clearance: 62.5 ml/min (by C-G formula based on Cr of 0.9).   Medical History: Past Medical History  Diagnosis Date  . COPD (chronic obstructive pulmonary disease)   . Diabetes mellitus without complication   . Hypertension   . Dementia   . A-fib     Medications:  ASA  Lipitor  Digoxin  Cardizem  Nexium  Lantus  Lopressor  Ntg  OxyIR  Requip    Assessment: 77 yo female with Afib/ACS on heparin. Heparin level (0.32) is at lower-end of goal range on 1100 units/hr.   Goal of Therapy:  Heparin level 0.3-0.7 units/ml Monitor platelets by anticoagulation protocol: Yes   Plan:  1. Increase IV heparin to 1200 units/hr to keep at-goal. 2. Heparin level, CBC with AM labs   Lorre Munroe, PharmD 01/02/2012,10:44 PM

## 2012-01-02 NOTE — ED Notes (Signed)
Breakfast tray ordered 

## 2012-01-02 NOTE — ED Notes (Signed)
Pt. Buttocks red. No ulcers noted.

## 2012-01-02 NOTE — Evaluation (Signed)
Physical Therapy Evaluation Patient Details Name: Jasmine Mcgee MRN: [id number] DOB: 1934/09/08 Today's Date: 01/02/2012 Time: 4098-1191 PT Time Calculation (min): 36 min  PT Assessment / Plan / Recommendation Clinical Impression  77 yo adm with afib with RVR and decr SaO2 from SNF. Unclear what pt's baseline level of function was. Pt with dependencies in all mobility due to Rt sided weakness and may benefit from further PT. Will initiate trial of therapies until family or staff at SNF can confirm her prior level of function.    PT Assessment  Patient needs continued PT services    Follow Up Recommendations  SNF        Barriers to Discharge Other (comment);None (anticipate return to SNF)      Equipment Recommendations  None recommended by PT    Recommendations for Other Services     Frequency Min 3X/week    Precautions / Restrictions Precautions Precautions: Fall Restrictions Other Position/Activity Restrictions: incr HR with activity   Pertinent Vitals/Pain HR at rest 86, sit EOB 97, with standing (<20 seconds) steadily incr to 142; on return to sit, returned to 102 in < 20 seconds; pt reported she felt light-headed SaO2 96% on RA throughout      Mobility  Bed Mobility Bed Mobility: Rolling Right;Rolling Left;Supine to Sit;Sitting - Scoot to [place] of Bed;Sit to Supine;Scooting to [facility] Rolling Right: 4: Min assist;With rail Rolling Left: 3: Mod assist;With rail Supine to Sit: 3: Mod assist;With rails;HOB elevated Sitting - Scoot to [place] of Bed: 3: Mod assist Sit to Supine: 4: Min assist;With rail;HOB flat Scooting to HOB: 1: +2 Total assist Scooting to [facility]: Patient Percentage: 0% Details for Bed Mobility Assistance: Pt required assist rolling to Rt to move Rt hand out of her way to not roll onto it; rolling to Lt required assist to incorporate use of RUE (initially she neglected arm and it remained behind her); coming to sit assist to torso due to  weakness Transfers Transfers: Sit to Stand;Stand to Sit Sit to Stand: 3: Mod assist;From elevated surface;From bed;With upper extremity assist Stand to Sit: 4: Min assist;Without upper extremity assist;To elevated surface;To bed Details for Transfer Assistance: Pt with poor ability to keep Rt hand on RW; leans to Rt as ascending to standing without recognizing she's leaning Rt (feels like she's leaning Lt when asked); reported feeling lightheaded and HR incr to 142 and returned to sitting EOB Ambulation/Gait Ambulation/Gait Assistance: Not tested (comment)              PT Diagnosis: Difficulty walking;Hemiplegia dominant side  PT Problem List: Decreased strength;Decreased activity tolerance;Decreased balance;Decreased mobility;Decreased cognition;Decreased safety awareness;Cardiopulmonary status limiting activity;Obesity PT Treatment Interventions: DME instruction;Gait training;Functional mobility training;Therapeutic activities;Balance training;Neuromuscular re-education;Cognitive remediation;Patient/family education   PT Goals Acute Rehab PT Goals PT Goal Formulation: Patient unable to participate in goal setting Time For Goal Achievement: 01/16/12 Potential to Achieve Goals: Fair (unclear of prior level of function at SNF) Pt will Roll Supine to Right Side: with supervision;with rail PT Goal: Rolling Supine to Right Side - Progress: Goal set today Pt will Roll Supine to Left Side: with supervision;with rail PT Goal: Rolling Supine to Left Side - Progress: Goal set today Pt will go Supine/Side to Sit: with HOB 0 degrees;with rail;Other (comment) (minguard assist) PT Goal: Supine/Side to Sit - Progress: Goal set today Pt will Sit at [facility] of Bed: with supervision;3-5 min;with no upper extremity support PT Goal: Sit at [facility] Of Bed - Progress: Goal set today Pt will  go Sit to Supine/Side: with supervision;with HOB 0 degrees PT Goal: Sit to Supine/Side - Progress: Goal set today Pt will  go Sit to Stand: with min assist;with upper extremity assist PT Goal: Sit to Stand - Progress: Goal set today Pt will Stand: with min assist;1 - 2 min;with bilateral upper extremity support PT Goal: Stand - Progress: Goal set today Pt will Ambulate: with +2 total assist;with least restrictive assistive device;16 - 50 feet PT Goal: Ambulate - Progress: Goal set today  Visit Information  Last PT Received On: 01/02/12 Assistance Needed: +2    Subjective Data  Subjective: Pt reports she came to hospital from home; MD notes from [facility]  Patient Stated Goal: unable to state due to dementia   Prior Functioning  Home Living Lives With: Other (Comment) (SNF) Available Help at Discharge: Other (Comment) (staff at SNF) Type of Home: Skilled Nursing Facility Prior Function Level of Independence: Needs assistance (pt unable to give reliable history) Communication Communication: No difficulties    Cognition  Overall Cognitive Status: Impaired Area of Impairment: Safety/judgement;Awareness of errors;Awareness of deficits;Memory Arousal/Alertness: Awake/alert Orientation Level: Disoriented to;Place;Time;Situation (knew she was in a hospital, but not which one or why) Behavior During Session: [facility] for tasks performed Memory Deficits: reporting she came in from home, however MD notes she came from [facility]. States she has been walking with a RW, yet cannot tell me last time she walked Safety/Judgement: Decreased safety judgement for tasks assessed;Decreased awareness of need for assistance Awareness of Errors: Assistance required to identify errors made;Assistance required to correct errors made Awareness of Errors - Other Comments: unaware she was leaning posteriorly in sitting or leaning to her Rt in standing Awareness of Deficits: denies weakness on Rt    Extremity/Trunk Assessment Right Lower Extremity Assessment RLE ROM/Strength/Tone: Deficits RLE ROM/Strength/Tone Deficits: hip  flexion 2+, knee extension 3-; incr edema Left Lower Extremity Assessment LLE ROM/Strength/Tone: Deficits LLE ROM/Strength/Tone Deficits: hip flexion 3, knee extension 3+; incr edema (< RLE)   Balance Balance Balance Assessed: Yes Static Sitting Balance Static Sitting - Balance Support: No upper extremity supported;Feet supported Static Sitting - Level of Assistance: 4: Min assist Static Sitting - Comment/# of Minutes: drifts posteriorly and to her Lt in sitting Dynamic Sitting Balance Dynamic Sitting - Balance Support: No upper extremity supported;Feet supported Dynamic Sitting - Level of Assistance: 4: Min assist Dynamic Sitting - Comments: able to reach to her feet to initiate donning socks Static Standing Balance Static Standing - Balance Support: Bilateral upper extremity supported Static Standing - Level of Assistance: 3: Mod assist Static Standing - Comment/# of Minutes: leans strongly to Rt  End of Session PT - End of Session Equipment Utilized During Treatment: Gait belt Activity Tolerance: Treatment limited secondary to medical complications (Comment) (HR to 142 and dizzy) Patient left: in bed;with call bell/phone within reach;with bed alarm set Nurse Communication: Mobility status  GP     Farran Amsden 01/02/2012, 4:58 PM  Pager [phone]

## 2012-01-03 DIAGNOSIS — R262 Difficulty in walking, not elsewhere classified: Secondary | ICD-10-CM | POA: Diagnosis present

## 2012-01-03 DIAGNOSIS — R296 Repeated falls: Secondary | ICD-10-CM | POA: Diagnosis present

## 2012-01-03 DIAGNOSIS — M79609 Pain in unspecified limb: Secondary | ICD-10-CM

## 2012-01-03 DIAGNOSIS — M79673 Pain in unspecified foot: Secondary | ICD-10-CM | POA: Diagnosis present

## 2012-01-03 DIAGNOSIS — R29818 Other symptoms and signs involving the nervous system: Secondary | ICD-10-CM

## 2012-01-03 DIAGNOSIS — F0390 Unspecified dementia without behavioral disturbance: Secondary | ICD-10-CM | POA: Diagnosis present

## 2012-01-03 DIAGNOSIS — I4891 Unspecified atrial fibrillation: Principal | ICD-10-CM | POA: Insufficient documentation

## 2012-01-03 DIAGNOSIS — Z515 Encounter for palliative care: Secondary | ICD-10-CM

## 2012-01-03 LAB — CBC
MCH: 28.8 pg (ref 26.0–34.0)
MCV: 84.2 fL (ref 78.0–100.0)
Platelets: 252 10*3/uL (ref 150–400)
RBC: 5.45 MIL/uL — ABNORMAL HIGH (ref 3.87–5.11)
RDW: 14.7 % (ref 11.5–15.5)

## 2012-01-03 LAB — BASIC METABOLIC PANEL
BUN: 6 mg/dL (ref 6–23)
Calcium: 9 mg/dL (ref 8.4–10.5)
Creatinine, Ser: 0.63 mg/dL (ref 0.50–1.10)
GFR calc non Af Amer: 86 mL/min — ABNORMAL LOW (ref >90)
Glucose, Bld: 117 mg/dL — ABNORMAL HIGH (ref 70–99)

## 2012-01-03 LAB — GLUCOSE, CAPILLARY: Glucose-Capillary: 204 mg/dL — ABNORMAL HIGH (ref 70–99)

## 2012-01-03 MED ORDER — FUROSEMIDE 20 MG PO TABS
20.0000 mg | ORAL_TABLET | Freq: Every morning | ORAL | Status: DC
  Administered 2012-01-04 – 2012-01-07 (×4): 20 mg via ORAL
  Filled 2012-01-03 (×4): qty 1

## 2012-01-03 MED ORDER — POTASSIUM CHLORIDE CRYS ER 20 MEQ PO TBCR
EXTENDED_RELEASE_TABLET | ORAL | Status: AC
  Administered 2012-01-03: 40 meq via ORAL
  Filled 2012-01-03: qty 2

## 2012-01-03 MED ORDER — POTASSIUM CHLORIDE CRYS ER 20 MEQ PO TBCR
40.0000 meq | EXTENDED_RELEASE_TABLET | Freq: Once | ORAL | Status: DC
  Filled 2012-01-03: qty 1

## 2012-01-03 MED ORDER — POTASSIUM CHLORIDE CRYS ER 20 MEQ PO TBCR
40.0000 meq | EXTENDED_RELEASE_TABLET | Freq: Every day | ORAL | Status: DC
  Administered 2012-01-03 – 2012-01-04 (×3): 40 meq via ORAL
  Filled 2012-01-03 (×2): qty 2

## 2012-01-03 NOTE — Progress Notes (Addendum)
Patient ZO:XWRUEAV R Grein      DOB: 06-04-1934      WUJ:[id number]  Consult for goals of care received.  Patient resting comfortably in the bed.  No family at bedside.  Spoke with nursing. Left a message on home phone for son.  I have a 1 pm opening today and will try to see if he can meet with me.   Braelen Sproule L. Ladona Ridgel, MD MBA The Palliative Medicine Team at [facility] Phone: [phone] Pager: [phone]   Addendum:Son confirms  1pm meeting with Palliative Care.  Oakley Orban L. Ladona Ridgel, MD MBA The Palliative Medicine Team at [facility] Phone: [phone] Pager: [phone]

## 2012-01-03 NOTE — Consult Note (Addendum)
Patient AO:ZHYQMVH R Quincy      DOB: [birth year]      QIO:[id number]     Consult Note from the Palliative Medicine Team at [facility]    Consult Requested by: Dr. Daleen Squibb    PCP: Dr. Donata Duff [facility], [place]) Reason for Consultation:     Phone Number: [phone]  Assessment of patients Current state: Patient is a 77 year old white female with a known past medical history of atrial fibrillation, diabetes, multi-infarct dementia, and a cardiomyopathy. Last known ejection fraction was 20-25%. The patient was recently seen at [facility] after being found down by her friend at her home in [place], [place]. Her son has noticed for the past 6 months and more recently in the past 6 weeks since injuring her foot that she has had a decline in her ability to care for herself. The patient stopped eating even though she had family and friends getting things at the supermarket, she stopped taking her insulin, and was having frequent falls. The patient was discharged from [facility] to [facility] for rehabilitation. She was not placed on Coumadin at that time because of her frequent falls, and her son also states she's "a free bleeder". The patient had been at [facility] is less than 24 hours when she developed tachypnea, tachycardia, and oxygen saturations of 86% on room air. I met with the patient who has intermittent insight into her own wishes but at baseline has a diagnosis of dementia which is likely multi-infarct in nature. She is able to express herself regarding her likes and dislikes and in conjunction with her son is helping to make decisions regarding her care. When I asked her about the falls at home and what she thinks the next up would be states "I guess we'll have to go somewhere". The patient's son [name] has been trying to keep her in her own home as he thought that she wanted to pass away in her own home but at this time she is unable to care for herself. The  patient's son desires for her to be comfortable without extensive hospital stays, nor does he desire extensive interventions like AICDs or other aggressive measures. He would expect that her medical conditions be managed to the best of our ability to try to maintain her health I keep her out of the hospital. He is familiar with hospice care as they took care of his father who had lung cancer and died 16 years ago. He would like to give his mother the opportunity for rehabilitation but does not want to have her in and out of the hospital on a regular basis. I therefore have suggested the palliative care services could follow her at the nursing facility and when it is appropriate for her to have her transition to hospice care. We completed a most form which indicates that he would prefer comfort measures to occur at the site of her residents unless they cannot manage her symptoms at which time she could be transported to the hospital for further palliative care. The patient herself on the surface appears to understand questions and appears to respond appropriately but when time is spent with her it is evident that she has significant memory deficits and does wax and wane in her cognitive capacity. It is therefore important to include her in the conversation but understand that her son is helping to guide her behind team and to some degree in person.   Goals of Care: 1.  Code  Status: DO NOT RESUSCITATE DO NOT INTUBATE has been confirmed. BiPAP was preferably not to be relied on. If she were to need BiPAP temperature to comfort this should be discussed with her son   2. Scope of Treatment: At this time in the hospital, Mr. Arch wishes are to stabilize her condition and maximize her medical therapy. Like her to return for rehabilitation preferably at [facility]. He would like her symptoms to be managed as aggressively as possible [facility] without returning her to the hospital with possible but  understands that if the symptoms are not able to be managed that she may need to return to the hospital. We are recommending a palliative care consultation at [facility] to assist in symptom management and helping transitioning to hospice when it is appropriate.  Continue all oral medications at this time. It is not necessary to monitor laboratories closely and less they are related to maximize in her therapies. Oxygen therapy may need to be ordered at the time of discharge for comfort.  Consultation with physical therapy should be obtained. The patient had been requested to wear a orthotic boot when walking because of the toe that she needed to have operated on. The patient has not been able to ambulate well without the boot I will attempt to obtain records to help assist in maximizing her rehabilitation potential. Please note that the patient's foot surgery canceled because her diabetes was poorly controlled.  The patient's son would desire to use oral antibiotics for simple infection side effect the patient's quality of life but would not wish for readmission to the hospital for IV antibiotics. The patient also has indicated she would not want a feeding tube or to be sustained on IV fluids at the end of her life.  4. Disposition: Once medically stable please transition back to skilled nursing facility preferably [facility]. Consultation with social worker should be made.   3. Symptom Management:   1.  foot pain: Please have physical therapy obtain appropriate orthotic for the patient's traumatized foot. Her son describes her boot is a Engineer, maintenance with a hard bottom 2. Dementia with an element of depression. I will request her social worker to attempt in engaging the patient in dignity therapy. 4. 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 She describes herself as enjoying antiquing, which her son reports his overtaking her home.   5.  Spiritual:We were not able to speak explicitly about her spiritual needs, but we will engage regarding this information during her next visit   Patient Documents Completed or Given: Document Given Completed  Advanced Directives Pkt    MOST  In the chart   DNR  In the chart   Gone from My Sight    Hard Choices      Brief HPI: 77 year old female admitted to the hospital with respiratory distress secondary to H. fibrillation with rapid ventricular response and decompensated heart failure. We passed to assist with goals of care.   ROS:  Patient relates that she's having no discomfort at this time she does not engage well with a list of questions and does not volunteer any symptoms for review of systems.    PMH:  Past Medical History  Diagnosis Date  . COPD (chronic obstructive pulmonary disease)   . Diabetes mellitus without complication   . Hypertension   . Dementia   . A-fib      ZOX:WRUEAVW reviewed. No pertinent past surgical history. I  have reviewed the FH and SH and  If appropriate update it with new information. Allergies  Allergen Reactions  . Celebrex (Celecoxib) Other (See Comments)    Per [facility]  . Lyrica (Pregabalin) Other (See Comments)    Per [facility] MAR   Scheduled Meds:   . aspirin EC  81 mg Oral Daily  . digoxin  0.25 mg Oral Daily  . furosemide  20 mg Oral q morning - 10a  . insulin aspart  0-15 Units Subcutaneous TID WC  . insulin glargine  40 Units Subcutaneous Q2000  . lisinopril  2.5 mg Oral Daily  . metoprolol  50 mg Oral BID  . pantoprazole  40 mg Oral Daily  . potassium chloride  40 mEq Oral Daily  . potassium chloride  40 mEq Oral Once  . rOPINIRole  2 mg Oral QHS  . sodium chloride  3 mL Intravenous Q12H  . [DISCONTINUED] atorvastatin  20 mg Oral Q2000  . [DISCONTINUED] furosemide  40 mg Oral BID   Continuous Infusions:   . [DISCONTINUED] diltiazem (CARDIZEM) infusion 5 mg/hr (01/03/12 0119)  . [DISCONTINUED] heparin 12 Units/kg/hr  (01/02/12 2230)  . [DISCONTINUED] heparin 1,200 Units/hr (01/02/12 2254)   PRN Meds:.acetaminophen, dextrose, [EXPIRED] dextrose, glucose-Vitamin C, nitroGLYCERIN, ondansetron (ZOFRAN) IV    BP 132/79  Pulse 87  Temp 97.8 F (36.6 C) (Oral)  Resp 17  Ht 5\' 7"  (1.702 m)  Wt 89 kg (196 lb 3.4 oz)  BMI 30.73 kg/m2  SpO2 95%   PPS: 30%   Intake/Output Summary (Last 24 hours) at 01/03/12 1418 Last data filed at 01/03/12 1150  Gross per 24 hour  Intake    797 ml  Output   5626 ml  Net  -4829 ml   LBM: 01/03/2012                      Stool Softner: Monitor   Physical Exam:  General: No acute distress awake alert oriented to herself and her son. She falls off to sleep easily when not engaged  HEENT:  Pupils are equal round and reactive to light, extraocular muscles appear to be take, mucous membranes are dry, dentition is marginal, no JVD is noted at this time  Chest:   Decreased with some basilar crackles no wheezing  CVS: Irregularly irregular positive S1 and S2 no S3-S4 no murmurs rubs or gallops  Abdomen: WUJ:WJXBJ soft nontender nondistended with positive bowel sounds no hepatosplenomegaly no guarding or rebound Ext have multiple area of ecchymosis, skin tears from falls. Neuro: Patient appears oriented to herself and her son but does not have judgment insight and long-term memory issues. She is short-term memory deficit and so may or may not remember interactions well. Her symptoms have become worse over the last 6 months.  Labs: CBC    Component Value Date/Time   WBC 9.7 01/03/2012 0610   RBC 5.45* 01/03/2012 0610   HGB 15.7* 01/03/2012 0610   HCT 45.9 01/03/2012 0610   PLT 252 01/03/2012 0610   MCV 84.2 01/03/2012 0610   MCH 28.8 01/03/2012 0610   MCHC 34.2 01/03/2012 0610   RDW 14.7 01/03/2012 0610   LYMPHSABS 2.1 01/02/2012 0214   MONOABS 1.4* 01/02/2012 0214   EOSABS 0.1 01/02/2012 0214   BASOSABS 0.0 01/02/2012 0214     CMP     Component Value  Date/Time   NA 139 01/03/2012 0610   K 2.9* 01/03/2012 0610   CL 98 01/03/2012 0610  CO2 28 01/03/2012 0610   GLUCOSE 117* 01/03/2012 0610   BUN 6 01/03/2012 0610   CREATININE 0.63 01/03/2012 0610   CALCIUM 9.0 01/03/2012 0610   PROT 6.4 01/02/2012 2145   ALBUMIN 2.9* 01/02/2012 2145   AST 32 01/02/2012 2145   ALT 23 01/02/2012 2145   ALKPHOS 90 01/02/2012 2145   BILITOT 1.3* 01/02/2012 2145   GFRNONAA 86* 01/03/2012 0610   GFRAA >90 01/03/2012 0610    Chest Xray Reviewed/Impressions: Cardiomegaly with central vascular congestion. Infrahilar opacities  are most in keeping with mild pulmonary edema.  Small pleural effusions and compressive atelectasis    Time In Time Out Total Time Spent with Patient Total Overall Time  1 PM   2:15 PM   40 minutes   75 minutes    Greater than 50%  of this time was spent counseling and coordinating care related to the above assessment and plan.  Hien Perreira L. Ladona Ridgel, MD MBA The Palliative Medicine Team at [facility] Phone: [phone] Pager: [phone]

## 2012-01-03 NOTE — Progress Notes (Signed)
[id number] To 6700 per bed accompanied by RN and NT alert but pleasantly confused pt.; placed pt on telemetry per order; skin issues noted on chart; placed bed at lowest level and bed alarm on. Call light within reach.

## 2012-01-03 NOTE — Progress Notes (Signed)
Admit Complaint: SOB/tachycardia/Afib w RVR  AC: Heparin for Afib/ACS (recent admit for Afib, not on AC due to falls). Baseline INR elevated at 1.47. Cards considering Xarelto at d/c.  HL this AM in goal range at 0.62. H/H, plts ok. No infusion line issues/bleeding per RN.  ID: Afeb, WBC wnl, no abx 11/16 UA grossly neg for UTI 11/16 MRSA PCR: neg  CV: H/o Afib Htn CHF HLD. No new EKG changes from last admit. ASA, statin, dig, dilt gtt, Lasix PO BID, lisinopril (started 11/16), metop.  Labile BPs 120-151/61-84, HR 80-90s. Dig level slightly SUB-therapeutic at 0.4, dose increased per cards. POC trop slightly positive at 0.32, f/u CE (-)x3  Endo: DM2. Lantus 40 daily, mod SSI, CBGs elevated at 160-178. TSH nml  GI/Nutr: LFTs nml, alb 2.9. Tbili elevated at 1.3. PPI PO  Neuro: H/o CVA, dementia. Currently confused. On Requip  Nephro: SCr 0.63 (baseline not known), K 2.9- RN to discuss supplementation with MD, other lytes ok  Pulm: COPD. RA  Hem/Onc: H/H 15.7/45.9, plts 252- all stable  PTA Med Issues: OxyIR  Best Practices: hep gtt  Plan: F/u K  ANTICOAGULATION CONSULT NOTE - Follow Up Consult  Pharmacy Consult for Heparin Indication: Afib/NSTEMI  Allergies  Allergen Reactions  . Celebrex (Celecoxib) Other (See Comments)    Per [facility]  . Lyrica (Pregabalin) Other (See Comments)    Per [facility]    Patient Measurements: Height: 5\' 7"  (170.2 cm) Weight: 196 lb 3.4 oz (89 kg) IBW/kg (Calculated) : 61.6  Heparin Dosing Weight: 80 kg  Vital Signs: Temp: 98.1 F (36.7 C) (11/17 0800) Temp src: Oral (11/17 0800) BP: 151/61 mmHg (11/17 0800) Pulse Rate: 88  (11/17 0800)  Labs:  Basename 01/03/12 0610 01/02/12 2146 01/02/12 2145 01/02/12 1507 01/02/12 0806 01/02/12 0224 01/02/12 0214  HGB 15.7* -- -- -- -- 15.3* --  HCT 45.9 -- -- -- -- 45.0 42.5  PLT 252 -- -- -- -- -- 261  APTT -- -- -- -- -- -- 33  LABPROT -- -- -- -- -- -- 17.4*  INR -- -- -- -- -- -- 1.47    HEPARINUNFRC 0.62 0.32 -- 0.51 -- -- --  CREATININE 0.63 -- -- -- -- 0.90 0.72  CKTOTAL -- -- -- -- -- -- --  CKMB -- -- -- -- -- -- --  TROPONINI -- -- <0.30 <0.30 <0.30 -- --    Estimated Creatinine Clearance: 70.7 ml/min (by C-G formula based on Cr of 0.63).   Medications:  ASA Lipitor Digoxin Cardizem Nexium Lantus Lopressor Ntg OxyIR Requip  Assessment: 77 yo female with Afib/ACS for Heparin.   Heparin for Afib/ACS (recent admit for Afib, not on AC due to falls). Baseline INR elevated at 1.47. Cards considering Xarelto at d/c. Follow up HL this AM in goal range at 0.62 on 1200 units/hr. H/H, plts ok. No infusion line issues/bleeding per RN.  SCr 0.63 (baseline not known), K 2.9- RN to discuss supplementation with MD, other lytes ok   Goal of Therapy:  Heparin level 0.3-0.7 units/ml Monitor platelets by anticoagulation protocol: Yes   Plan:  -Continue IV heparin at 1200 units/hr to keep at-goal.  -Heparin level, CBC with AM labs -F/u s/sx bleeding -F/u K supplementation   Thank you for the consult.  Tomi Bamberger, PharmD Clinical Pharmacist Pager: [phone] Pharmacy: [phone] 01/03/2012 8:59 AM

## 2012-01-03 NOTE — Progress Notes (Signed)
Patient ID: MODESTA EVEREST, female   DOB: January 05, 1935, 77 y.o.   MRN: [id number]   Patient Name: ADRYANA HEIMBIGNER Date of Encounter: 01/03/2012    SUBJECTIVE Patient comfortable this morning per [facility]. Severely demented. Made DNR per son and medical team. Palliative care to see.  CURRENT MEDS    . aspirin EC  81 mg Oral Daily  . digoxin  0.25 mg Oral Daily  . furosemide  20 mg Oral q morning - 10a  . insulin aspart  0-15 Units Subcutaneous TID WC  . insulin glargine  40 Units Subcutaneous Q2000  . lisinopril  2.5 mg Oral Daily  . metoprolol  50 mg Oral BID  . pantoprazole  40 mg Oral Daily  . potassium chloride  40 mEq Oral Daily  . potassium chloride  40 mEq Oral Once  . rOPINIRole  2 mg Oral QHS  . sodium chloride  3 mL Intravenous Q12H  . [DISCONTINUED] atorvastatin  20 mg Oral Q2000  . [DISCONTINUED] furosemide  40 mg Oral BID    OBJECTIVE  Filed Vitals:   01/02/12 2315 01/03/12 0433 01/03/12 0800 01/03/12 1105  BP: 130/66 128/84 151/61 132/79  Pulse: 83 81 88 87  Temp: 97.8 F (36.6 C) 97.6 F (36.4 C) 98.1 F (36.7 C) 97.8 F (36.6 C)  TempSrc: Oral Oral Oral Oral  Resp: 23 28 21 17   Height:      Weight:  196 lb 3.4 oz (89 kg)    SpO2: 98% 97% 97% 95%    Intake/Output Summary (Last 24 hours) at 01/03/12 1202 Last data filed at 01/03/12 1150  Gross per 24 hour  Intake    949 ml  Output   5626 ml  Net  -4677 ml   Filed Weights   01/02/12 0455 01/02/12 1243 01/03/12 0433  Weight: 198 lb 10.2 oz (90.101 kg) 194 lb 0.1 oz (88 kg) 196 lb 3.4 oz (89 kg)    PHYSICAL EXAM  General: Pleasant, NAD. Neuro: Alert and oriented X 3. Moves all extremities spontaneously. Psych: Normal affect. HEENT:  Normal  Neck: Supple without bruits or JVD. Lungs:  Resp regular and unlabored, CTA. Heart: RRR no s3, s4, or murmurs. Abdomen: Soft, non-tender, non-distended, BS + x 4.  Extremities: No clubbing, cyanosis or edema. DP/PT/Radials 2+ and equal  bilaterally.  Accessory Clinical Findings  CBC  Basename 01/03/12 0610 01/02/12 0224 01/02/12 0214  WBC 9.7 -- 9.6  NEUTROABS -- -- 5.9  HGB 15.7* 15.3* --  HCT 45.9 45.0 --  MCV 84.2 -- 84.0  PLT 252 -- 261   Basic Metabolic Panel  Basename 01/03/12 0610 01/02/12 0224 01/02/12 0214  NA 139 140 --  K 2.9* 3.2* --  CL 98 103 --  CO2 28 -- 25  GLUCOSE 117* 135* --  BUN 6 9 --  CREATININE 0.63 0.90 --  CALCIUM 9.0 -- 8.6  MG -- -- --  PHOS -- -- --   Liver Function Tests  [facility] 01/02/12 2145  AST 32  ALT 23  ALKPHOS 90  BILITOT 1.3*  PROT 6.4  ALBUMIN 2.9*   No results found for this basename: LIPASE:2,AMYLASE:2 in the last 72 hours Cardiac Enzymes  Basename 01/02/12 2145 01/02/12 1507 01/02/12 0806  CKTOTAL -- -- --  CKMB -- -- --  CKMBINDEX -- -- --  TROPONINI <0.30 <0.30 <0.30   BNP No components found with this basename: POCBNP:3 D-Dimer No results found for this basename: DDIMER:2 in the  last 72 hours Hemoglobin A1C No results found for this basename: HGBA1C in the last 72 hours Fasting Lipid Panel No results found for this basename: CHOL,HDL,LDLCALC,TRIG,CHOLHDL,LDLDIRECT in the last 72 hours Thyroid Function Tests  Basename 01/02/12 2145  TSH 1.569  T4TOTAL --  T3FREE --  THYROIDAB --    TELE Atrial fibrillation with well-controlled ventricular rate  ECG   Radiology/Studies  Dg Chest Port 1 View  01/02/2012  *RADIOLOGY REPORT*  Clinical Data: Shortness of breath, atrial fibrillation  PORTABLE CHEST - 1 VIEW  Comparison: None.  Findings: Cardiomegaly.  Central vascular congestion.  Bibasilar and infrahilar opacities.  Layering pleural effusions.  No pneumothorax.  Aortic atherosclerosis.  No acute osseous finding. Partially imaged lumbar fusion hardware.  IMPRESSION: Cardiomegaly with central vascular congestion. Infrahilar opacities are most in keeping with mild pulmonary edema.  Small pleural effusions and compressive atelectasis.    Original Report Authenticated By: Jearld Lesch, M.D.     ASSESSMENT AND PLAN     Ventricular rate well controlled. Diuresed significantly. I've gone through orders and simplified. We'll replace potassium, decreased Lasix daily, discontinued IV diltiazem, continue dig and BB. We'll discontinue heparin and statin. Discontinue regular blood draws.  Signed, Valera Castle MD

## 2012-01-04 ENCOUNTER — Encounter (HOSPITAL_COMMUNITY): Payer: Self-pay | Admitting: General Practice

## 2012-01-04 ENCOUNTER — Inpatient Hospital Stay (HOSPITAL_COMMUNITY): Payer: Medicare Other

## 2012-01-04 DIAGNOSIS — I10 Essential (primary) hypertension: Secondary | ICD-10-CM

## 2012-01-04 DIAGNOSIS — I4891 Unspecified atrial fibrillation: Secondary | ICD-10-CM | POA: Diagnosis present

## 2012-01-04 DIAGNOSIS — E119 Type 2 diabetes mellitus without complications: Secondary | ICD-10-CM

## 2012-01-04 DIAGNOSIS — J9600 Acute respiratory failure, unspecified whether with hypoxia or hypercapnia: Secondary | ICD-10-CM

## 2012-01-04 DIAGNOSIS — I5043 Acute on chronic combined systolic (congestive) and diastolic (congestive) heart failure: Secondary | ICD-10-CM

## 2012-01-04 DIAGNOSIS — E785 Hyperlipidemia, unspecified: Secondary | ICD-10-CM

## 2012-01-04 DIAGNOSIS — Z8673 Personal history of transient ischemic attack (TIA), and cerebral infarction without residual deficits: Secondary | ICD-10-CM

## 2012-01-04 DIAGNOSIS — I214 Non-ST elevation (NSTEMI) myocardial infarction: Secondary | ICD-10-CM | POA: Insufficient documentation

## 2012-01-04 LAB — BASIC METABOLIC PANEL
BUN: 9 mg/dL (ref 6–23)
CO2: 25 mEq/L (ref 19–32)
Calcium: 8.8 mg/dL (ref 8.4–10.5)
GFR calc non Af Amer: 83 mL/min — ABNORMAL LOW (ref >90)
Glucose, Bld: 188 mg/dL — ABNORMAL HIGH (ref 70–99)
Sodium: 132 mEq/L — ABNORMAL LOW (ref 135–145)

## 2012-01-04 LAB — GLUCOSE, CAPILLARY
Glucose-Capillary: 183 mg/dL — ABNORMAL HIGH (ref 70–99)
Glucose-Capillary: 197 mg/dL — ABNORMAL HIGH (ref 70–99)
Glucose-Capillary: 217 mg/dL — ABNORMAL HIGH (ref 70–99)

## 2012-01-04 MED ORDER — METOPROLOL TARTRATE 1 MG/ML IV SOLN
2.5000 mg | INTRAVENOUS | Status: DC | PRN
  Filled 2012-01-04: qty 5

## 2012-01-04 MED ORDER — POTASSIUM CHLORIDE CRYS ER 20 MEQ PO TBCR
20.0000 meq | EXTENDED_RELEASE_TABLET | Freq: Every day | ORAL | Status: DC
  Administered 2012-01-05 – 2012-01-07 (×3): 20 meq via ORAL
  Filled 2012-01-04 (×3): qty 1

## 2012-01-04 MED ORDER — METOPROLOL TARTRATE 50 MG PO TABS
50.0000 mg | ORAL_TABLET | Freq: Three times a day (TID) | ORAL | Status: DC
  Administered 2012-01-04 – 2012-01-06 (×8): 50 mg via ORAL
  Filled 2012-01-04 (×11): qty 1

## 2012-01-04 NOTE — Progress Notes (Signed)
Inpatient Diabetes Program Recommendations  AACE/ADA: New Consensus Statement on Inpatient Glycemic Control (2013)  Target Ranges:  Prepandial:   less than 140 mg/dL      Peak postprandial:   less than 180 mg/dL (1-2 hours)      Critically ill patients:  140 - 180 mg/dL    Results for CHANON, LONEY (MRN [id number]) as of 01/04/2012 13:09  Ref. Range 01/03/2012 08:04 01/03/2012 11:18 01/03/2012 19:53  Glucose-Capillary Latest Range: 70-99 mg/dL 409 (H) 811 (H) 914 (H)   Results for CATHERYNE, DEFORD (MRN [id number]) as of 01/04/2012 13:09  Ref. Range 01/04/2012 07:50 01/04/2012 12:03  Glucose-Capillary Latest Range: 70-99 mg/dL 086 (H) 578 (H)   Patient having issues with elevated postprandial CBGs.  Inpatient Diabetes Program Recommendations Correction (SSI): Please add HS scale coverage to current SSI regimen. Insulin - Meal Coverage: Please consider adding meal coverage- Novolog 4 units tid with meals.  Note: Will follow. Ambrose Finland RN, MSN, CDE Diabetes Coordinator Inpatient Diabetes Program [phone]

## 2012-01-04 NOTE — Progress Notes (Signed)
Pt's heart rate increased to 140's-150's sustaining while at rest. Pt was asymptomatic. No distress noted. MD was paged and new orders were received. Will cont to monitor.

## 2012-01-04 NOTE — Progress Notes (Signed)
Covering Clinical Child psychotherapist (CSW) left a message for pt son Jasmine Mcgee to discuss discharge planning for pt. CSW informed that family has not done a bed hold at [facility]. Full assessment to follow to determine whether family would like a new SNF search or to see if [facility] can accept pt when medically stable.   Theresia Bough, MSW, Theresia Majors [phone]

## 2012-01-04 NOTE — Progress Notes (Addendum)
Patient Jasmine Mcgee      DOB: [birth year]      ZDG:[id number]   Palliative Medicine Team at [facility] Progress Note    Subjective: Patient states she doesn't feel well in general but cannot tell me what is disturbing her. She states she has some abdominal discomfort this morning but that has gone. She appears fatigued and depressed. She is not is engaging and she was yesterday     Filed Vitals:   01/04/12 2151  BP: 150/118  Pulse: 46  Temp: 97.9 F (36.6 C)  Resp: 16   Physical exam:   Generally in no acute distress Pupils are equal round and reactive to light Extraocular muscles appear to be intact Chest is decreased but clear to auscultation and don't appreciate rhonchi rest or wheezes Cardiovascular is irregular positive S1 and S2 I don't appreciate an S3 or S4 Abdomen is obese soft nontender nondistended Extremities show multiple areas of ecchymosis and skin tears Neurologically the patient is not oriented to time or place but is oriented to person.  Lab Results  Component Value Date   CREATININE 0.70 01/04/2012   BUN 9 01/04/2012   NA 132* 01/04/2012   K 4.3 01/04/2012   CL 96 01/04/2012   CO2 25 01/04/2012    Assessment and plan Patient is a 77 year old white female admitted from [facility] with atrial fibrillation with rapid ventricular response and respiratory failure. The patient is being treated medically at this time and is improved. I spoke with her son this morning and updated him on the recommendations from cardiology about disposition later this week. Put him in touch with the social worker to help with securing the patient's bed at [facility]. At this time the patient's overall prognosis for longevity is limited based on her multiple medical conditions. The patient's son desires to have palliative care services following discharge. Requested a recommendation in the discharge summary and at felt to be placed for palliative care services to  consult. Patient may be eligible for hospice in the near future depending on how her condition progresses, palliative care services can help in this transition when the time is appropriate.:  1. DO NOT RESUSCITATE do not him to date. Patient's son desires to follow comfort measures and try not to return her to the hospital once her medical issues have been stabilized. He would prefer to have symptom management in the place of her presidency if possible.  2. Palliative care services should be recommended at the time of discharge to age and symptom management.  3. foot pain: Noted request for CAM boot to right lower extremity will order  3. Disposition: Back to Jasmine Mcgee is once medically stable.    Total time 15 minutes  Lashanta Elbe L. Jasmine Ridgel, MD MBA The Palliative Medicine Team at [facility] Phone: [phone] Pager: [phone]

## 2012-01-04 NOTE — Progress Notes (Signed)
Physical Therapy Treatment Patient Details Name: Jasmine Mcgee MRN: [id number] DOB: 1934/12/06 Today's Date: 01/04/2012 Time: 4098-1191 PT Time Calculation (min): 23 min  PT Assessment / Plan / Recommendation Comments on Treatment Session  Making gains in functional mobility, and it is apparent pt enjoys moving and getting OOB; Feel PT goals are still congruent with overall Goals of Care; Continue to recommend +2 assist for safety with mobility    Follow Up Recommendations  SNF     Does the patient have the potential to tolerate intense rehabilitation     Barriers to Discharge        Equipment Recommendations  None recommended by PT    Recommendations for Other Services    Frequency Min 3X/week   Plan Discharge plan remains appropriate    Precautions / Restrictions Precautions Precautions: Fall Required Braces or Orthoses: Other Brace/Splint Other Brace/Splint: Per son, [name] (who goes by "Lax"), pt is to walk in a CAM boot -- left note requesting order from MD, and will notify Care Coord Restrictions Other Position/Activity Restrictions: incr HR with activity   Pertinent Vitals/Pain Per tele monitor, HR increased with transfers; was 114 bpm sitting in recliner at end of session    Mobility  Bed Mobility Bed Mobility: Supine to Sit;Sitting - Scoot to Edge of Bed Supine to Sit: 3: Mod assist;With rails;HOB elevated Sitting - Scoot to Edge of Bed: 4: Min assist;With rail Details for Bed Mobility Assistance: Cues for technique, safety, and to initiate; Required physical assist to elevate trunk from bed; Noted it looks like pt has improved awareness of R hand and arm during bed mobility activities; Noted good ability to organize weight shifting and trunk control during reciprocal scooting to EOB and Reciprocal scooting hips back into recliner Transfers Transfers: Sit to Stand;Stand to Sit Sit to Stand: 3: Mod assist;From elevated surface;From bed;With upper extremity  assist Stand to Sit: 1: +2 Total assist;To chair/3-in-1 Stand to Sit: Patient Percentage: 50% Details for Transfer Assistance: Better ability to control R hand on RW; Required +2 assist for stand to sit as pt was beginning to sit prior to fully squaring off in front of recliner; Recliner had to be moved to pt; Pt unable to take pivot steps today with particular difficulty moving RLE Ambulation/Gait Ambulation/Gait Assistance: Not tested (comment);Other (comment) (Hopeful to work on amb with CAM boot next session)    Exercises     PT Diagnosis:    PT Problem List:   PT Treatment Interventions:     PT Goals Acute Rehab PT Goals PT Goal Formulation: Patient unable to participate in goal setting Time For Goal Achievement: 01/16/12 Potential to Achieve Goals: Fair Pt will Roll Supine to Left Side: with supervision;with rail PT Goal: Rolling Supine to Left Side - Progress: Progressing toward goal Pt will go Supine/Side to Sit: with HOB 0 degrees;with rail;Other (comment) PT Goal: Supine/Side to Sit - Progress: Progressing toward goal Pt will Sit at [facility] of Bed: with supervision;3-5 min;with no upper extremity support PT Goal: Sit at [facility] Of Bed - Progress: Progressing toward goal Pt will go Sit to Stand: with min assist;with upper extremity assist PT Goal: Sit to Stand - Progress: Progressing toward goal Pt will Stand: with min assist;1 - 2 min;with bilateral upper extremity support PT Goal: Stand - Progress: Progressing toward goal Pt will Ambulate: with +2 total assist;with least restrictive assistive device;16 - 50 feet  Visit Information  Last PT Received On: 01/04/12 Assistance Needed: +2  Subjective Data  Subjective: Agreeable to getting OOB   Cognition  Overall Cognitive Status: Impaired Area of Impairment: Safety/judgement;Awareness of errors;Awareness of deficits;Memory Arousal/Alertness: Awake/alert Orientation Level: Disoriented to;Time;Situation Behavior During  Session: WFL for tasks performed Memory Deficits: Unable to recall events of previous session Safety/Judgement: Decreased safety judgement for tasks assessed;Decreased awareness of need for assistance Awareness of Deficits: denies weakness on Rt    Balance  Static Sitting Balance Static Sitting - Balance Support: Left upper extremity supported;Bilateral upper extremity supported Static Sitting - Level of Assistance: 5: Stand by assistance (Close guard without physical contact) Static Sitting - Comment/# of Minutes: 5 minutes EOB, no significant post drift noted Static Standing Balance Static Standing - Balance Support: Bilateral upper extremity supported Static Standing - Level of Assistance: 4: Min assist;3: Mod assist Static Standing - Comment/# of Minutes: Stood with bil UE support on rW for hygeine (pt had been incontinent of bowel); no significant R lean noted  End of Session PT - End of Session Equipment Utilized During Treatment: Gait belt Activity Tolerance: Patient tolerated treatment well Patient left: in chair;with call bell/phone within reach;with family/visitor present;with chair alarm set Nurse Communication: Mobility status   GP     Olen Pel [place],  [phone]  01/04/2012, 2:00 PM

## 2012-01-04 NOTE — Progress Notes (Signed)
UR COMPLETED  

## 2012-01-04 NOTE — Progress Notes (Signed)
Occupational Therapy Evaluation Patient Details Name: Jasmine Mcgee MRN: [id number] DOB: 1934-02-29 Today's Date: 01/04/2012 Time: 4098-1191 OT Time Calculation (min): 16 min  OT Assessment / Plan / Recommendation Clinical Impression  77 yo demented resident of [facility] who presented to ED tachypneic, ashen and tachycardic. s/p  CVA, CAD,HTN. Pt most likely currently at baseline level with ADL. Noted plan below is to D/C to SNF, then to hospice. Will defer further OT intervention to SNF as appropriate.    OT Assessment  All further OT needs can be met in the next venue of care    Follow Up Recommendations  SNF    Barriers to Discharge      Equipment Recommendations  None recommended by OT    Recommendations for Other Services  none  Frequency    eval only   Precautions / Restrictions Precautions Precautions: Fall Precaution Comments: dementia Required Braces or Orthoses: Other Brace/Splint Other Brace/Splint: Per son, [name] (who goes by "Asman"), pt is to walk in a CAM boot -- left note requesting order from MD, and will notify Care Coord Restrictions Other Position/Activity Restrictions: incr HR with activity   Pertinent Vitals/Pain No c/o pain    ADL  Eating/Feeding: Set up Where Assessed - Eating/Feeding: Chair Grooming: Minimal assistance;Brushing hair Where Assessed - Grooming: Supported sitting Upper Body Bathing: Maximal assistance Where Assessed - Upper Body Bathing: Supported sit to stand Lower Body Bathing: Maximal assistance Where Assessed - Lower Body Bathing: Supported sit to stand Upper Body Dressing: Moderate assistance Where Assessed - Upper Body Dressing: Supported sitting Lower Body Dressing: Maximal assistance Where Assessed - Lower Body Dressing: Supported sit to Pharmacist, hospital: Maximal assistance Toilet Transfer Method: Squat pivot Equipment Used: Gait belt Transfers/Ambulation Related to ADLs: Mod A squat pivot ADL Comments:  Most likely at baseline level of ADL    OT Diagnosis: Generalized weakness  OT Problem List: Decreased activity tolerance;Decreased strength;Decreased cognition;Impaired UE functional use OT Treatment Interventions:     OT Goals Acute Rehab OT Goals OT Goal Formulation:  (eval only)  Visit Information  Last OT Received On: 01/04/12 Assistance Needed: +2    Subjective Data      Prior Functioning     Home Living Lives With: Other (Comment) (SNF) Available Help at Discharge: Skilled Nursing Facility Prior Function Level of Independence: Needs assistance Needs Assistance: Bathing;Dressing;Grooming;Toileting;Meal Prep;Light Housekeeping;Gait;Transfers Bath: Moderate Dressing: Maximal Grooming: Moderate Toileting: Maximal Meal Prep: Total Light Housekeeping: Total Gait Assistance: ? most likely nonambulatory Transfer Assistance: MAx A Communication Communication: No difficulties Dominant Hand: Right         Vision/Perception     Cognition  Overall Cognitive Status: History of cognitive impairments - at baseline Area of Impairment: Attention;Memory;Following commands;Safety/judgement;Awareness of errors;Awareness of deficits;Problem solving;Executive functioning Arousal/Alertness: Awake/alert Orientation Level: Disoriented to;Place;Time;Situation Behavior During Session: WFL for tasks performed Current Attention Level: Sustained Memory: Decreased recall of precautions Memory Deficits: Unable to recall events of previous session Following Commands: Follows one step commands consistently Safety/Judgement: Decreased awareness of safety precautions;Decreased safety judgement for tasks assessed;Decreased awareness of need for assistance Awareness of Errors: Assistance required to identify errors made;Assistance required to correct errors made Awareness of Errors - Other Comments: unaware of poor positioninfg i chair Awareness of Deficits: denies weakness on Rt Problem  Solving: mod A functional basic Executive Functioning: poor reaoning.insight Cognition - Other Comments: baseline    Extremity/Trunk Assessment Right Upper Extremity Assessment RUE ROM/Strength/Tone: Deficits RUE ROM/Strength/Tone Deficits: limited stregth - atixic RUE Sensation: WFL -  Light Touch;WFL - Proprioception RUE Coordination: Deficits RUE Coordination Deficits: due to previous CVA Left Upper Extremity Assessment LUE ROM/Strength/Tone: WFL for tasks assessed LUE Sensation: WFL - Light Touch;WFL - Proprioception LUE Coordination: WFL - gross/fine motor Right Lower Extremity Assessment RLE ROM/Strength/Tone: Deficits Left Lower Extremity Assessment LLE ROM/Strength/Tone: Deficits Trunk Assessment Trunk Assessment: Normal     Mobility Bed Mobility Bed Mobility: Not assessed Supine to Sit: 3: Mod assist;With rails;HOB elevated Sitting - Scoot to Edge of Bed: 4: Min assist;With rail Details for Bed Mobility Assistance: Cues for technique, safety, and to initiate; Required physical assist to elevate trunk from bed; Noted it looks like pt has improved awareness of R hand and arm during bed mobility activities; Noted good ability to organize weight shifting and trunk control during reciprocal scooting to EOB and Reciprocal scooting hips back into recliner Transfers Transfers: Sit to Stand;Stand to Sit Sit to Stand: 3: Mod assist;From elevated surface;From bed;With upper extremity assist Stand to Sit: 3: Mod assist Stand to Sit: Patient Percentage: 50% Details for Transfer Assistance: Better ability to control R hand on RW; Required +2 assist for stand to sit as pt was beginning to sit prior to fully squaring off in front of recliner; Recliner had to be moved to pt; Pt unable to take pivot steps today with particular difficulty moving RLE     Shoulder Instructions     Exercise     Balance Static Sitting Balance Static Sitting - Balance Support: Left upper extremity  supported;Bilateral upper extremity supported Static Sitting - Level of Assistance: 5: Stand by assistance (Close guard without physical contact) Static Sitting - Comment/# of Minutes: 5 minutes EOB, no significant post drift noted Static Standing Balance Static Standing - Balance Support: Bilateral upper extremity supported Static Standing - Level of Assistance: 4: Min assist;3: Mod assist Static Standing - Comment/# of Minutes: Stood with bil UE support on rW for hygeine (pt had been incontinent of bowel); no significant R lean noted   End of Session OT - End of Session Equipment Utilized During Treatment: Gait belt Activity Tolerance: Patient tolerated treatment well Patient left: in chair;with call bell/phone within reach Nurse Communication: Mobility status  GO     Tatsuo Musial,HILLARY 01/04/2012, 3:40 PM [facility], OTR/L  [phone] 01/04/2012

## 2012-01-04 NOTE — Progress Notes (Signed)
Patient Name: Jasmine Mcgee Date of Encounter: 01/04/2012     Principal Problem:  *Atrial fibrillation with RVR Active Problems:  Dementia  Palliative care encounter  Repeated falls  Difficulty walking  Foot pain  NSTEMI (non-ST elevated myocardial infarction)  Diastolic CHF, acute on chronic  HTN (hypertension)  Hyperlipidemia  Diabetes mellitus  H/O: CVA (cerebrovascular accident)    SUBJECTIVE: Feels well. SOB improved. No cp, palpitations or lightheadedness.   OBJECTIVE  Filed Vitals:   01/03/12 1700 01/03/12 1958 01/03/12 2005 01/04/12 0514  BP: 126/76 125/75  148/98  Pulse: 83 54 97 107  Temp: 97.6 F (36.4 C) 97.9 F (36.6 C)  98.4 F (36.9 C)  TempSrc: Oral Oral  Oral  Resp: 18 16  18   Height:      Weight:      SpO2: 96% 99%  99%    Intake/Output Summary (Last 24 hours) at 01/04/12 0843 Last data filed at 01/04/12 0700  Gross per 24 hour  Intake    531 ml  Output   2651 ml  Net  -2120 ml   Weight change:   PHYSICAL EXAM  General: Appears older than stated age, well developed, well nourished, in no acute distress. Head: Normocephalic, atraumatic, sclera non-icteric, no xanthomas, nares are without discharge.  Neck: Supple without bruits or JVP 6-7 cm. Lungs:  Resp regular and unlabored, CTAB without wheezes, rales or rhonchi. Heart: Irregularly irregular, tachycardic, no s3, s4, or murmurs. Abdomen: Soft, non-tender, non-distended, BS + x 4.  Msk:  Strength and tone appears normal for age. Extremities: No clubbing, cyanosis or edema. DP/PT/Radials 2+ and equal bilaterally. Neuro: Alert and oriented X 3. Moves all extremities spontaneously. Skin: healing lacerations on ventral aspect of R hand Psych: Normal affect.  LABS:  Recent Labs  [facility] 01/03/12 0610 01/02/12 0224 01/02/12 0214   WBC 9.7 -- 9.6   HGB 15.7* 15.3* --   HCT 45.9 45.0 --   MCV 84.2 -- 84.0   PLT 252 -- 261    Lab 01/03/12 0610 01/02/12 2145 01/02/12 0224  01/02/12 0214  NA 139 -- 140 136  K 2.9* -- 3.2* 3.2*  CL 98 -- 103 103  CO2 28 -- -- 25  BUN 6 -- 9 11  CREATININE 0.63 -- 0.90 0.72  CALCIUM 9.0 -- -- 8.6  PROT -- 6.4 -- --  BILITOT -- 1.3* -- --  ALKPHOS -- 90 -- --  ALT -- 23 -- --  AST -- 32 -- --  AMYLASE -- -- -- --  LIPASE -- -- -- --  GLUCOSE 117* -- 135* 139*   Recent Labs  Basename 01/02/12 2145 01/02/12 1507 01/02/12 0806   CKTOTAL -- -- --   CKMB -- -- --   CKMBINDEX -- -- --   TROPONINI <0.30 <0.30 <0.30    Basename 01/02/12 2145  TSH 1.569  T4TOTAL --  T3FREE --  THYROIDAB --   TELE: atrial fibrillation w/ RVR, 120-150 bpm  Radiology/Studies:  Dg Chest Port 1 View  01/02/2012  *RADIOLOGY REPORT*  Clinical Data: Shortness of breath, atrial fibrillation  PORTABLE CHEST - 1 VIEW  Comparison: None.  Findings: Cardiomegaly.  Central vascular congestion.  Bibasilar and infrahilar opacities.  Layering pleural effusions.  No pneumothorax.  Aortic atherosclerosis.  No acute osseous finding. Partially imaged lumbar fusion hardware.  IMPRESSION: Cardiomegaly with central vascular congestion. Infrahilar opacities are most in keeping with mild pulmonary edema.  Small pleural effusions and compressive atelectasis.  Original Report Authenticated By: Jearld Lesch, M.D.     Current Medications:     . aspirin EC  81 mg Oral Daily  . digoxin  0.25 mg Oral Daily  . furosemide  20 mg Oral q morning - 10a  . insulin aspart  0-15 Units Subcutaneous TID WC  . insulin glargine  40 Units Subcutaneous Q2000  . lisinopril  2.5 mg Oral Daily  . metoprolol  50 mg Oral BID  . pantoprazole  40 mg Oral Daily  . potassium chloride  40 mEq Oral Daily  . potassium chloride  40 mEq Oral Once  . rOPINIRole  2 mg Oral QHS  . sodium chloride  3 mL Intravenous Q12H  . [DISCONTINUED] atorvastatin  20 mg Oral Q2000  . [DISCONTINUED] furosemide  40 mg Oral BID    ASSESSMENT AND PLAN:  1. Atrial fibrillation with RVR- continues to  have paroxysms of RVR. CHADS2 = 5 however has underlying dementia and frequent falls precluding use of anticoagulation. TSH WNL. Heparin drip withdrawn yesterday for comfort measures. Rate-control attempted with PO Lopressor BID. Digoxin continued. Looks like rate comes down to low 100s after each Lopressor dose. May need to consider adding diltiazem on a scheduled or PRN basis vs increasing Lopressor dose and/or frequency to TID for more continuous AVN blockade. With underlying CHF, would benefit from the latter. Will discuss rate-control strategy with MD.    2. Acute on chronic combined CHF- likely tachy-mediated. pBNP elevated on admission. CXR revealed central vascular congestion, mild pulmonary edema and small pleural effusions with compressive atelectasis. Echo at [facility] revealed normal size LV with an EF 20-25% (global HK), PASP 30-35 mmHg, mildly reduced RV function, mod LAE, and mild MR/TR. Would like to r/o PE (admitted for tachypnea, tachycardia, hypoxia, RV dysfxn, obese, leg tenderness on exam) as exacerbating factor, but given comfort measures and risk of fall/dementia, anticoagulation, extensive diagnostic measures and blood draws have been deferred. Has diuresed significantly. Total I/O: -6732 cc. Lasix down-titrated yesterday. Potassium repleted. Continue BB. Low-dose ACEi started.   3. NSTEMI- very mild troponin elevation (0.32) on admission in the setting of a-fib w/RVR representing demand ischemia. Three subsequent sets of troponin have returned WNL after rate-control achieved.   4. Multi-infarct dementia- history of CVA. Memory and cognitive deficits.   5. HTN- room for rate-control. As above, may need to consider adding scheduled or PRN diltiazem to achieve adequate rate-control  6. HL- statin discontinued for comfort measures.   7. History of CVA- with resultant multiple-infarct dementia.  8. Frequent falls- precluding anticoagulation. SNF, then hospice services  recommended (see below).  Disposition- patient with guidance of family given underlying dementia have discussed with palliative care preference for comfort measures, avoiding frequent hospital admissions/aggressive interventions (i.e. ICD placement) with every effort to treat her underlying medical issues as an outpatient in an attempt to keep her out of the hospital. DNR confirmed. They have recommended d/c to SNF (once stable) then to hospice (when appropriate) with palliative care following as an outpatient.   Signed, R. Hurman Horn, PA-C 01/04/2012, 8:43 AM  Patient seen and examined, and all notes reviewed.  I have completely reviewed the note of Mr.Arguello and reviewed options.    She is still a bit fast, with adequate BP.  Lungs are clear, but in trend review she maintains rates up to 130.  Will increase metoprolol to 50 mg three times daily, and continue dig.  Dig will likely need to be decreased  anyway as the dose is likely to high, and it would be nice to avoid dig toxicity.  Would be ideal to get rate under better control before SNF.  Will also recheck K as 105meq/ day is likely too much K replacement.  Will aim for SNF on Wednesday as she is relatively stable, but unlikely to be managed at the SNF.

## 2012-01-05 DIAGNOSIS — I5043 Acute on chronic combined systolic (congestive) and diastolic (congestive) heart failure: Secondary | ICD-10-CM

## 2012-01-05 DIAGNOSIS — I509 Heart failure, unspecified: Secondary | ICD-10-CM

## 2012-01-05 DIAGNOSIS — E119 Type 2 diabetes mellitus without complications: Secondary | ICD-10-CM

## 2012-01-05 LAB — GLUCOSE, CAPILLARY: Glucose-Capillary: 243 mg/dL — ABNORMAL HIGH (ref 70–99)

## 2012-01-05 LAB — BASIC METABOLIC PANEL
BUN: 13 mg/dL (ref 6–23)
Creatinine, Ser: 0.79 mg/dL (ref 0.50–1.10)
GFR calc Af Amer: >90 mL/min (ref >90)
GFR calc non Af Amer: 80 mL/min — ABNORMAL LOW (ref >90)

## 2012-01-05 LAB — DIGOXIN LEVEL: Digoxin Level: 1 ng/mL (ref 0.8–2.0)

## 2012-01-05 MED ORDER — INSULIN GLARGINE 100 UNIT/ML ~~LOC~~ SOLN
42.0000 [IU] | Freq: Every day | SUBCUTANEOUS | Status: DC
  Administered 2012-01-05 – 2012-01-06 (×2): 42 [IU] via SUBCUTANEOUS

## 2012-01-05 NOTE — Progress Notes (Signed)
Nutrition Brief Note  Patient identified on the Malnutrition Screening Tool (MST) Report. Pt is currently eating 90-100% of Heart Healthy meals. She declines any issues with her weight. RN confirms that pt is eating well.  Body mass index is 30.73 kg/(m^2). Pt meets criteria for Obese Class I based on current BMI.   Labs and medications reviewed from nursing home and current hospitalization. Pt with skin tears, however no skin breakdown/pressure ulcers noted.   No nutrition interventions warranted at this time. Pt declined supplements. If nutrition issues arise, please consult RD.   Jarold Motto MS, RD, LDN Pager: [phone] After-hours pager: [phone]

## 2012-01-05 NOTE — Progress Notes (Signed)
Orthopedic Tech Progress Note Patient Details:  Jasmine Mcgee 1935-02-03 [id number] CAM walker applied to Right LE per order; fitting tolerated well Ortho Devices Type of Ortho Device: CAM walker Ortho Device/Splint Location: Right LE Ortho Device/Splint Interventions: Application   [name] 01/05/2012, 11:16 AM

## 2012-01-05 NOTE — Progress Notes (Addendum)
Patient Name: Jasmine Mcgee Date of Encounter: 01/05/2012     Principal Problem:  *Atrial fibrillation with RVR Active Problems:  Dementia  Palliative care encounter  Repeated falls  Difficulty walking  Foot pain  NSTEMI (non-ST elevated myocardial infarction)  Diastolic CHF, acute on chronic  HTN (hypertension)  Hyperlipidemia  Diabetes mellitus  H/O: CVA (cerebrovascular accident)  Acute on chronic combined systolic and diastolic congestive heart failure    SUBJECTIVE  Patient is stable and feels better this am.  Looks to be in good spirits.    CURRENT MEDS    . aspirin EC  81 mg Oral Daily  . digoxin  0.25 mg Oral Daily  . furosemide  20 mg Oral q morning - 10a  . insulin aspart  0-15 Units Subcutaneous TID WC  . insulin glargine  40 Units Subcutaneous Q2000  . lisinopril  2.5 mg Oral Daily  . metoprolol  50 mg Oral TID  . pantoprazole  40 mg Oral Daily  . potassium chloride  20 mEq Oral Daily  . rOPINIRole  2 mg Oral QHS  . sodium chloride  3 mL Intravenous Q12H  . [DISCONTINUED] metoprolol  50 mg Oral BID  . [DISCONTINUED] potassium chloride  40 mEq Oral Daily  . [DISCONTINUED] potassium chloride  40 mEq Oral Once    OBJECTIVE  Filed Vitals:   01/04/12 1353 01/04/12 2151 01/04/12 2354 01/05/12 0626  BP: 141/78 150/118 133/78 128/77  Pulse: 80 86 87 88  Temp: 98.5 F (36.9 C) 97.9 F (36.6 C) 97.6 F (36.4 C) 97.9 F (36.6 C)  TempSrc: Oral Oral Oral Oral  Resp: 18 16 16 18   Height:      Weight:  196 lb 3.4 oz (89 kg)    SpO2: 94% 96% 98% 97%    Intake/Output Summary (Last 24 hours) at 01/05/12 0835 Last data filed at 01/05/12 0629  Gross per 24 hour  Intake    480 ml  Output   2500 ml  Net  -2020 ml   Filed Weights   01/02/12 1243 01/03/12 0433 01/04/12 2151  Weight: 194 lb 0.1 oz (88 kg) 196 lb 3.4 oz (89 kg) 196 lb 3.4 oz (89 kg)    PHYSICAL EXAM  General: Pleasant, NAD. Neuro: Alert and oriented X 3. Moves all extremities  spontaneously. Psych: Normal affect. HEENT:  Normal  Neck: Supple without bruits or JVD. Lungs:  Resp regular and unlabored, CTA. Heart: irregularly irregular and still mildly rapid.   Abdomen: Soft, non-tender, non-distended, BS + x 4.  Extremities: No clubbing, cyanosis or edema. DP/PT/Radials 2+ and equal bilaterally.  Accessory Clinical Findings  CBC  Basename 01/03/12 0610  WBC 9.7  NEUTROABS --  HGB 15.7*  HCT 45.9  MCV 84.2  PLT 252   Basic Metabolic Panel  Basename 01/04/12 1122 01/03/12 0610  NA 132* 139  K 4.3 2.9*  CL 96 98  CO2 25 28  GLUCOSE 188* 117*  BUN 9 6  CREATININE 0.70 0.63  CALCIUM 8.8 9.0  MG -- --  PHOS -- --   Liver Function Tests  [facility] 01/02/12 2145  AST 32  ALT 23  ALKPHOS 90  BILITOT 1.3*  PROT 6.4  ALBUMIN 2.9*   No results found for this basename: LIPASE:2,AMYLASE:2 in the last 72 hours Cardiac Enzymes  Basename 01/02/12 2145 01/02/12 1507  CKTOTAL -- --  CKMB -- --  CKMBINDEX -- --  TROPONINI <0.30 <0.30   BNP No components found  with this basename: POCBNP:3 D-Dimer No results found for this basename: DDIMER:2 in the last 72 hours Hemoglobin A1C No results found for this basename: HGBA1C in the last 72 hours Fasting Lipid Panel No results found for this basename: CHOL,HDL,LDLCALC,TRIG,CHOLHDL,LDLDIRECT in the last 72 hours Thyroid Function Tests  Basename 01/02/12 2145  TSH 1.569  T4TOTAL --  T3FREE --  Marko Plume --    TELE   Radiology/Studies  Dg Chest Port 1 View  01/02/2012  *RADIOLOGY REPORT*  Clinical Data: Shortness of breath, atrial fibrillation  PORTABLE CHEST - 1 VIEW  Comparison: None.  Findings: Cardiomegaly.  Central vascular congestion.  Bibasilar and infrahilar opacities.  Layering pleural effusions.  No pneumothorax.  Aortic atherosclerosis.  No acute osseous finding. Partially imaged lumbar fusion hardware.  IMPRESSION: Cardiomegaly with central vascular congestion. Infrahilar opacities  are most in keeping with mild pulmonary edema.  Small pleural effusions and compressive atelectasis.   Original Report Authenticated By: Jearld Lesch, M.D.    Dg Chest Port 1v Same Day  01/04/2012  *RADIOLOGY REPORT*  Clinical Data: 77 year old female congestive heart failure.  PORTABLE CHEST - 1 VIEW SAME DAY  Comparison: 01/02/2012.  Findings: Portable AP seated upright view 1224 hours.  Small bilateral pleural effusions appear decreased with improved bibasilar ventilation. Stable cardiomegaly and mediastinal contours.  No pneumothorax or edema.  IMPRESSION: Decreased small bilateral pleural effusions and improved bibasilar ventilation.  No acute edema.   Original Report Authenticated By: Erskine Speed, M.D.     ASSESSMENT AND PLAN  1.  Pleural effusions   -- these are improved by xray and her lungs are relatively clear.  Continue very low dose furosemide.  Check BMET tomorrow.  2.  Atrial fib  -- poor control but improved.  Will not increase dose  -- I called last night when computer said 46 HR, but nurse told me it was actually 100 3.  Hyperglycemia  -- not very good control.  Lantus increased to 42 units per day.   Shoot for dc to [facility] on Thursday.  Signed, Shawnie Pons MD, [facility], FSCAI

## 2012-01-05 NOTE — Clinical Social Work Psychosocial (Signed)
     Clinical Social Work Department BRIEF PSYCHOSOCIAL ASSESSMENT 01/05/2012  Patient:  Jasmine Mcgee, Jasmine Mcgee     Account Number:  [id number]     Admit date:  01/02/2012  Clinical Social Worker:  Robin Searing  Date/Time:  01/05/2012 11:55 AM  Referred by:  Physician  Date Referred:  01/05/2012 Referred for  SNF Placement   Other Referral:   Interview type:  Patient Other interview type:   Son, [name], at bedside    PSYCHOSOCIAL DATA Living Status:  FACILITY Admitted from facility:  [facility] Level of care:  Skilled Nursing Facility Primary support name:  SonTammy Sours [phone] Primary support relationship to patient:  FAMILY Degree of support available:   good    CURRENT CONCERNS Current Concerns  Post-Acute Placement   Other Concerns:   Patient was admitted to [facility] from [facility] and brought here the same day- per son, she was living alone in [place], [place] and he is uncertain if she will be able to ever return there- he is, however, holding on to her place for now so she does not give up hope.  He prefers return to SNF bed at [facility] if possible as this is near his home-    He plans to apply for Medicaid (LTC)    SOCIAL WORK ASSESSMENT / PLAN Patient was admitted to [facility] from [facility] and brought here the same day- per son, she was living alone in [place], [place] and he is uncertain if she will be able to ever return there- he is, however, holding on to her place for now so she does not give up hope.  He prefers return to SNF bed at [facility] if possible as this is near his home-    He plans to apply for Medicaid (LTC)   Assessment/plan status:  Other - See comment Other assessment/ plan:   Update FL2 and SNF of probable d/c Thursday per MD note-   Information/referral to community resources:   SNF  DSS  EMS    PATIENTS/FAMILYS RESPONSE TO PLAN OF CARE: Son agrees to above- appreciative of  team's assistance with mother's care.

## 2012-01-05 NOTE — Progress Notes (Signed)
Physical Therapy Treatment Patient Details Name: Jasmine Mcgee MRN: [id number] DOB: 06-Dec-1934 Today's Date: 01/05/2012 Time: 4098-1191 PT Time Calculation (min): 20 min  PT Assessment / Plan / Recommendation Comments on Treatment Session  Pt's transfers, standing tolerance, and mobility quite affected by fatigue today    Follow Up Recommendations  SNF     Does the patient have the potential to tolerate intense rehabilitation     Barriers to Discharge        Equipment Recommendations  None recommended by PT    Recommendations for Other Services    Frequency Min 3X/week   Plan Discharge plan remains appropriate    Precautions / Restrictions Precautions Precautions: Fall Precaution Comments: dementia Required Braces or Orthoses: Other Brace/Splint Other Brace/Splint: Per son, [name] (who goes by "Cerino"), pt is to walk in a CAM boot -- left note requesting order from MD, and will notify Care Coord   Pertinent Vitals/Pain no apparent distress     Mobility  Bed Mobility Bed Mobility: Supine to Sit;Sitting - Scoot to Edge of Bed Supine to Sit: 3: Mod assist;With rails;HOB elevated Sitting - Scoot to Edge of Bed: 3: Mod assist;With rail Details for Bed Mobility Assistance: Cues for technique, safety, and to initiate; Required physical assist to elevate trunk from bed; Noted it looks like pt has improved awareness of R hand and arm during bed mobility activities; Noted good ability to organize weight shifting and trunk control during reciprocal scooting to EOB and Reciprocal scooting hips back into recliner Transfers Transfers: Sit to Stand;Stand to Sit (X 2 reps) Sit to Stand: 1: +2 Total assist Sit to Stand: Patient Percentage: 50% Stand to Sit: 1: +2 Total assist Stand to Sit: Patient Percentage: 20% Stand Pivot Transfers: 1: +2 Total assist Stand Pivot Transfers: Patient Percentage: 40% Details for Transfer Assistance: Seemingly more fatiqued than yesterday,  repuiring more verbal and tactile cueing at hips and sternum for upright posture; Again ahd quite an uncontrolled descent to chair both standing trials Ambulation/Gait Ambulation/Gait Assistance: Not tested (comment) (Attempted, but pt sitting to recliner after about 1-15 s sta)    Exercises     PT Diagnosis:    PT Problem List:   PT Treatment Interventions:     PT Goals Acute Rehab PT Goals PT Goal Formulation: Patient unable to participate in goal setting Time For Goal Achievement: 01/16/12 Potential to Achieve Goals: Fair Pt will Roll Supine to Left Side: with supervision;with rail PT Goal: Rolling Supine to Left Side - Progress: Progressing toward goal Pt will go Supine/Side to Sit: with HOB 0 degrees;with rail;Other (comment) PT Goal: Supine/Side to Sit - Progress: Progressing toward goal Pt will Sit at [facility] of Bed: with supervision;3-5 min;with no upper extremity support PT Goal: Sit at [facility] Of Bed - Progress: Partly met Pt will go Sit to Stand: with min assist;with upper extremity assist PT Goal: Sit to Stand - Progress: Progressing toward goal Pt will Stand: with min assist;1 - 2 min;with bilateral upper extremity support PT Goal: Stand - Progress: Not progressing Pt will Ambulate: with +2 total assist;with least restrictive assistive device;16 - 50 feet PT Goal: Ambulate - Progress: Not progressing  Visit Information  Last PT Received On: 01/05/12 Assistance Needed: +2    Subjective Data  Subjective: Reports tired today, but agreeable to OOB Patient Stated Goal: unable to state due to dementia   Cognition  Overall Cognitive Status: History of cognitive impairments - at baseline Arousal/Alertness: Awake/alert Orientation Level: Disoriented to;Place;Time;Situation Behavior  During Session: [facility] for tasks performed Current Attention Level: Sustained Memory: Decreased recall of precautions Memory Deficits: Unable to recall events of previous session Following Commands:  Follows one step commands consistently Cognition - Other Comments: baseline    Pension scheme manager Standing - Balance Support: Bilateral upper extremity supported Static Standing - Level of Assistance: 4: Min assist;3: Mod assist Static Standing - Comment/# of Minutes: Hands supported on RW; with incr fatigue today, only tolerated standing approx 10-15 seconds before leaning back for the chair  End of Session PT - End of Session Equipment Utilized During Treatment: Gait belt Activity Tolerance: Patient limited by fatigue Patient left: in chair;with call bell/phone within reach Nurse Communication: Mobility status   GP     Olen Pel [place], [place] [phone]  01/05/2012, 3:29 PM

## 2012-01-06 LAB — BASIC METABOLIC PANEL
BUN: 14 mg/dL (ref 6–23)
CO2: 27 mEq/L (ref 19–32)
Calcium: 9.1 mg/dL (ref 8.4–10.5)
Creatinine, Ser: 0.7 mg/dL (ref 0.50–1.10)
GFR calc non Af Amer: 83 mL/min — ABNORMAL LOW (ref >90)
Glucose, Bld: 108 mg/dL — ABNORMAL HIGH (ref 70–99)

## 2012-01-06 LAB — GLUCOSE, CAPILLARY
Glucose-Capillary: 109 mg/dL — ABNORMAL HIGH (ref 70–99)
Glucose-Capillary: 194 mg/dL — ABNORMAL HIGH (ref 70–99)

## 2012-01-06 MED ORDER — POTASSIUM CHLORIDE CRYS ER 20 MEQ PO TBCR
40.0000 meq | EXTENDED_RELEASE_TABLET | Freq: Once | ORAL | Status: AC
  Administered 2012-01-06: 40 meq via ORAL
  Filled 2012-01-06: qty 2

## 2012-01-06 NOTE — Progress Notes (Signed)
Patient ID: PHILLIS THACKERAY, female   DOB: Jan 05, 1935, 77 y.o.   MRN: [id number]   Patient Name: NEZIAH VOGELGESANG Date of Encounter: 01/06/2012     Principal Problem:  *Atrial fibrillation with RVR Active Problems:  Dementia  Palliative care encounter  Repeated falls  Difficulty walking  Foot pain  NSTEMI (non-ST elevated myocardial infarction)  Diastolic CHF, acute on chronic  HTN (hypertension)  Hyperlipidemia  Diabetes mellitus  H/O: CVA (cerebrovascular accident)  Acute on chronic combined systolic and diastolic congestive heart failure    SUBJECTIVE  Patient is stable and feels better this am.  Looks to be in good spirits.  Working with PT  CURRENT MEDS    . aspirin EC  81 mg Oral Daily  . digoxin  0.25 mg Oral Daily  . furosemide  20 mg Oral q morning - 10a  . insulin aspart  0-15 Units Subcutaneous TID WC  . insulin glargine  42 Units Subcutaneous Q2000  . lisinopril  2.5 mg Oral Daily  . metoprolol  50 mg Oral TID  . pantoprazole  40 mg Oral Daily  . potassium chloride  20 mEq Oral Daily  . rOPINIRole  2 mg Oral QHS  . sodium chloride  3 mL Intravenous Q12H  . [DISCONTINUED] insulin glargine  40 Units Subcutaneous Q2000    OBJECTIVE  Filed Vitals:   01/05/12 1322 01/05/12 1750 01/05/12 2205 01/06/12 0536  BP: 119/64 134/74 141/66 128/73  Pulse: 81 98 95 80  Temp: 97.7 F (36.5 C) 97.9 F (36.6 C) 98 F (36.7 C) 98 F (36.7 C)  TempSrc: Oral Oral Oral Oral  Resp: 18 18 18 16   Height:      Weight:   182 lb (82.555 kg)   SpO2: 95% 98% 94% 97%    Intake/Output Summary (Last 24 hours) at 01/06/12 0756 Last data filed at 01/06/12 0500  Gross per 24 hour  Intake    843 ml  Output   2925 ml  Net  -2082 ml   Filed Weights   01/03/12 0433 01/04/12 2151 01/05/12 2205  Weight: 196 lb 3.4 oz (89 kg) 196 lb 3.4 oz (89 kg) 182 lb (82.555 kg)    PHYSICAL EXAM  General: Pleasant, NAD. Neuro: Alert and oriented X 3. Moves all extremities  spontaneously. Psych: Normal affect. HEENT:  Normal  Neck: Supple without bruits or JVD. Lungs:  Resp regular and unlabored, CTA. Heart: irregularly irregular and still mildly rapid.   Abdomen: Soft, non-tender, non-distended, BS + x 4.  Extremities: No clubbing, cyanosis or edema. DP/PT/Radials 2+ and equal bilaterally. Boot on LLE Foley catheter   Accessory Clinical Findings  CBC No results found for this basename: WBC:2,NEUTROABS:2,HGB:2,HCT:2,MCV:2,PLT:2 in the last 72 hours Basic Metabolic Panel  Basename 01/06/12 0600 01/05/12 1015  NA 135 133*  K 3.5 3.4*  CL 100 96  CO2 27 29  GLUCOSE 108* 205*  BUN 14 13  CREATININE 0.70 0.79  CALCIUM 9.1 9.1  MG -- --  PHOS -- --    TELE afib with good rate control 01/06/2012    Radiology/Studies  Dg Chest Port 1 View  01/02/2012  *RADIOLOGY REPORT*  Clinical Data: Shortness of breath, atrial fibrillation  PORTABLE CHEST - 1 VIEW  Comparison: None.  Findings: Cardiomegaly.  Central vascular congestion.  Bibasilar and infrahilar opacities.  Layering pleural effusions.  No pneumothorax.  Aortic atherosclerosis.  No acute osseous finding. Partially imaged lumbar fusion hardware.  IMPRESSION: Cardiomegaly with central vascular congestion.  Infrahilar opacities are most in keeping with mild pulmonary edema.  Small pleural effusions and compressive atelectasis.   Original Report Authenticated By: Jearld Lesch, M.D.    Dg Chest Port 1v Same Day  01/04/2012  *RADIOLOGY REPORT*  Clinical Data: 77 year old female congestive heart failure.  PORTABLE CHEST - 1 VIEW SAME DAY  Comparison: 01/02/2012.  Findings: Portable AP seated upright view 1224 hours.  Small bilateral pleural effusions appear decreased with improved bibasilar ventilation. Stable cardiomegaly and mediastinal contours.  No pneumothorax or edema.  IMPRESSION: Decreased small bilateral pleural effusions and improved bibasilar ventilation.  No acute edema.   Original Report  Authenticated By: Erskine Speed, M.D.     ASSESSMENT AND PLAN  1.  Pleural effusions   -- these are improved by xray and her lungs are relatively clear.  Continue very low dose furosemide.  Suppl K 2.  Atrial fib  -- better rate control 3.  Hyperglycemia  -- not very good control.  Lantus increased to 42 units per day.   Shoot for dc to [facility] tomorrow   Signed, Charlton Haws MD, [facility], [facility]

## 2012-01-06 NOTE — Progress Notes (Signed)
SNF bed confirmed for tomorrow at [facility]- son to do paperwork for readmission. Will f/u in a.m.  Reece Levy, MSW, LCSWA [phone] unit [phone] coverage

## 2012-01-07 ENCOUNTER — Encounter (HOSPITAL_COMMUNITY): Payer: Self-pay | Admitting: Nurse Practitioner

## 2012-01-07 MED ORDER — FUROSEMIDE 20 MG PO TABS: 20.0000 mg | ORAL_TABLET | Freq: Every morning | ORAL | Status: AC

## 2012-01-07 MED ORDER — INSULIN GLARGINE 100 UNIT/ML ~~LOC~~ SOLN: 44.0000 [IU] | Freq: Every day | SUBCUTANEOUS | Status: AC

## 2012-01-07 MED ORDER — POTASSIUM CHLORIDE CRYS ER 20 MEQ PO TBCR: 20.0000 meq | EXTENDED_RELEASE_TABLET | Freq: Every day | ORAL | Status: AC

## 2012-01-07 MED ORDER — INSULIN GLARGINE 100 UNIT/ML ~~LOC~~ SOLN: 44.0000 [IU] | Freq: Every day | SUBCUTANEOUS | Status: DC

## 2012-01-07 MED ORDER — METOPROLOL SUCCINATE ER 50 MG PO TB24
75.0000 mg | ORAL_TABLET | Freq: Two times a day (BID) | ORAL | Status: DC
  Administered 2012-01-07: 75 mg via ORAL
  Filled 2012-01-07 (×2): qty 1

## 2012-01-07 MED ORDER — DIGOXIN 250 MCG PO TABS: 0.2500 mg | ORAL_TABLET | Freq: Every day | ORAL | Status: AC

## 2012-01-07 MED ORDER — LISINOPRIL 2.5 MG PO TABS: 2.5000 mg | ORAL_TABLET | Freq: Every day | ORAL | Status: DC

## 2012-01-07 MED ORDER — METOPROLOL SUCCINATE ER 25 MG PO TB24: 75.0000 mg | ORAL_TABLET | Freq: Two times a day (BID) | ORAL | Status: AC

## 2012-01-07 NOTE — Progress Notes (Signed)
Son unable to safely move pt from wheelchair to his car for transport to [facility]. Pt will instead be transported via EMS to facility. Transport has been arranged. Discharge packet will be sent with EMS.

## 2012-01-07 NOTE — Progress Notes (Signed)
Report called to nurse at [facility].   

## 2012-01-07 NOTE — Progress Notes (Signed)
Patient Name: Jasmine Mcgee Date of Encounter: 01/07/2012    Principal Problem:  *Atrial fibrillation with RVR Active Problems:  Dementia  Palliative care encounter  Repeated falls  Difficulty walking  Foot pain  Diastolic CHF, acute on chronic  HTN (hypertension)  Hyperlipidemia  Diabetes mellitus  H/O: CVA (cerebrovascular accident)  Acute on chronic combined systolic and diastolic congestive heart failure   SUBJECTIVE  No chest pain, sob, palpitations.  Remains in afib with rates generally in the 90's to low 100's at rest.  Eager to go to SNF today.  CURRENT MEDS    . aspirin EC  81 mg Oral Daily  . digoxin  0.25 mg Oral Daily  . furosemide  20 mg Oral q morning - 10a  . insulin aspart  0-15 Units Subcutaneous TID WC  . insulin glargine  42 Units Subcutaneous Q2000  . lisinopril  2.5 mg Oral Daily  . metoprolol  50 mg Oral TID  . pantoprazole  40 mg Oral Daily  . potassium chloride  20 mEq Oral Daily  . [COMPLETED] potassium chloride  40 mEq Oral Once  . rOPINIRole  2 mg Oral QHS  . sodium chloride  3 mL Intravenous Q12H    OBJECTIVE  Filed Vitals:   01/06/12 0536 01/06/12 1657 01/06/12 2257 01/07/12 0546  BP: 128/73 125/70 123/88 138/61  Pulse: 80 86 83 62  Temp: 98 F (36.7 C) 98.5 F (36.9 C) 97.9 F (36.6 C) 97.8 F (36.6 C)  TempSrc: Oral Oral Oral Oral  Resp: 16 16 18 18   Height:      Weight:   175 lb 4.8 oz (79.516 kg)   SpO2: 97% 96% 97% 95%    Intake/Output Summary (Last 24 hours) at 01/07/12 0738 Last data filed at 01/07/12 0600  Gross per 24 hour  Intake    840 ml  Output    550 ml  Net    290 ml   Filed Weights   01/04/12 2151 01/05/12 2205 01/06/12 2257  Weight: 196 lb 3.4 oz (89 kg) 182 lb (82.555 kg) 175 lb 4.8 oz (79.516 kg)   PHYSICAL EXAM  General: Pleasant, NAD. Neuro: Alert and oriented X 3. Moves all extremities spontaneously. Psych: Normal affect. HEENT:  Normal  Neck: Supple without JVD. Lungs:  Resp  regular and unlabored, diminished breath sounds left base. Heart: irreg, irreg, no s3, s4, or murmurs. Abdomen: Soft, non-tender, non-distended, BS + x 4.  Extremities: No clubbing, cyanosis.  Trace bilat LE edema, L>R. DP/PT/Radials 1+ and equal bilaterally.  Accessory Clinical Findings  Basic Metabolic Panel  Basename 01/06/12 0600 01/05/12 1015  NA 135 133*  K 3.5 3.4*  CL 100 96  CO2 27 29  GLUCOSE 108* 205*  BUN 14 13  CREATININE 0.70 0.79  CALCIUM 9.1 9.1  MG -- --  PHOS -- --   TELE  afib 90's to low 100's, increasing to 1-teens with activity.  Radiology/Studies  Dg Chest Port 1v Same Day  01/04/2012  *RADIOLOGY REPORT*  Clinical Data: 77 year old female congestive heart failure.  PORTABLE CHEST - 1 VIEW SAME DAY  Comparison: 01/02/2012.  Findings: Portable AP seated upright view 1224 hours.  Small bilateral pleural effusions appear decreased with improved bibasilar ventilation. Stable cardiomegaly and mediastinal contours.  No pneumothorax or edema.  IMPRESSION: Decreased small bilateral pleural effusions and improved bibasilar ventilation.  No acute edema.   Original Report Authenticated By: Erskine Speed, M.D.    ASSESSMENT AND PLAN  1.  Afib RVR:  Remains in afib though asymptomatic.  Rate is relatively controlled - 90's to low 100's at rest.  Up to 1-teens while up eating.  Room to titrate bb - change to toprol xl 75mg  bid  Cont digoxin and f/u level @ f/u in a few weeks (level 1.0 11/19).  Despite CHADS2=5, no coumadin/NOAT 2/2 fall risk.  Cont asa.  2.  HTN:  Stable.  3.  Acute on chronic combined syst/diast chf:  Volume stable.  Weight down to 175 from 198 on admission with minus 13.7 L i/o.  Cont bb, acei, digoxin.  Consider addition of spiro as outpt.  She does not wish to proceed with aggressive therapies such as ICD.  4.  Hypokalemia:  Cont KCl supplementation.  5.  DM:  BG's remain modestly elevated.  Titrate lantus further.    6. Pleural effusions:   Improved.  Signed, Nicolasa Ducking NP Patient examined and agree. Ready for discharge to [facility] with Palliative Care.  Valera Castle, MD 01/07/2012 8:50 AM

## 2012-01-07 NOTE — Progress Notes (Signed)
Physical Therapy Treatment Patient Details Name: Jasmine Mcgee MRN: [id number] DOB: 04-08-1934 Today's Date: 01/07/2012 Time: 0865-7846 PT Time Calculation (min): 23 min  PT Assessment / Plan / Recommendation Comments on Treatment Session  Pt. with unsafe technique in transfers and needed max safety and technique cues.      Follow Up Recommendations  SNF     Does the patient have the potential to tolerate intense rehabilitation     Barriers to Discharge        Equipment Recommendations  None recommended by PT    Recommendations for Other Services    Frequency Min 3X/week   Plan Discharge plan remains appropriate    Precautions / Restrictions Precautions Precautions: Fall Precaution Comments: dementia Required Braces or Orthoses: Other Brace/Splint Other Brace/Splint: cam boot Restrictions Weight Bearing Restrictions: No Other Position/Activity Restrictions: incr HR with activity   Pertinent Vitals/Pain No indication of pain or distress    Mobility  Bed Mobility Bed Mobility: Supine to Sit;Sitting - Scoot to Edge of Bed Supine to Sit: 3: Mod assist;With rails;HOB elevated Sitting - Scoot to Edge of Bed: 3: Mod assist;With rail Sit to Supine: Not Tested (comment) Details for Bed Mobility Assistance: cues for safety and technique Transfers Transfers: Sit to Stand;Stand to Sit Sit to Stand: 1: +2 Total assist Sit to Stand: Patient Percentage: 50% Stand to Sit: 1: +2 Total assist Stand to Sit: Patient Percentage: 10% Stand Pivot Transfers: 1: +2 Total assist Stand Pivot Transfers: Patient Percentage: 30% Details for Transfer Assistance: Pt. was somewhat impulsive as she began to approach chair, using unsafe technique and requiring safety cueing.  Pt. needed manual assist to move right LE and maintain it in correct postition for transfer. Ambulation/Gait Ambulation/Gait Assistance: Not tested (comment) Assistive device: Rolling walker    Exercises     PT  Diagnosis:    PT Problem List:   PT Treatment Interventions:     PT Goals Acute Rehab PT Goals PT Goal: Supine/Side to Sit - Progress: Progressing toward goal PT Goal: Sit at Edge Of Bed - Progress: Progressing toward goal PT Goal: Sit to Stand - Progress: Progressing toward goal  Visit Information  Last PT Received On: 01/07/12 Assistance Needed: +2    Subjective Data  Subjective: agreeable to OOB   Cognition  Overall Cognitive Status: History of cognitive impairments - at baseline Area of Impairment: Attention;Memory;Following commands;Safety/judgement;Awareness of errors;Awareness of deficits;Problem solving;Executive functioning Arousal/Alertness: Awake/alert Orientation Level: Disoriented to;Place;Time;Situation Behavior During Session: [facility] for tasks performed Current Attention Level: Sustained Memory: Decreased recall of precautions Following Commands: Follows one step commands consistently Safety/Judgement: Decreased awareness of safety precautions;Decreased safety judgement for tasks assessed;Decreased awareness of need for assistance Awareness of Errors: Assistance required to identify errors made;Assistance required to correct errors made Cognition - Other Comments: baseline    Balance     End of Session PT - End of Session Equipment Utilized During Treatment: Gait belt Activity Tolerance: Patient limited by fatigue Patient left: in chair;with call bell/phone within reach Nurse Communication: Mobility status   GP     Ferman Hamming 01/07/2012, 2:07 PM Weldon Picking PT Acute Rehab Services [phone] Beeper [phone]

## 2012-01-07 NOTE — Clinical Social Work Note (Signed)
Patient ready for discharge to SNF - [facility] - today. Wille Celeste at [facility] informed and agreeable.  Discharge summary faxed via [facility].  Son called and agreeable to transfer.  Discharge packet prepared and placed in shadow chart for discharge.  FL2 reviewed w RN and updated as needed.  Son informed that since patient can sit in chair, EMS transport may not be covered by insurance.  Son states that he will attempt to transport in his car, and if this cannot be safely accomplished he requests that we call EMS.  EMS form prepared and placed w discharge packet.  Santa Genera, LCSW Clinical Social Worker [phone])

## 2012-01-07 NOTE — Discharge Summary (Signed)
Patient ID: Jasmine Mcgee,  MRN: [id number], DOB/AGE: 75-Aug-1936 75 y.o.  Admit date: 01/02/2012 Discharge date: 01/07/2012  Primary Care Provider: Dr. Alwyn Ren Primary Cardiologist: T. Judith Demps, MD  Discharge Diagnoses Principal Problem:  *Atrial fibrillation with RVR  **Focus on rate-control.  Not an oral anticoagulation candidate secondary to h/o falls. Active Problems:  Acute on chronic combined systolic and diastolic congestive heart failure  **Net negative diuresis of 13.7 L this admission with reduction in weight from 198 lbs on admission to 175 lbs @ discharge.  Dementia  Repeated falls  HTN (hypertension)  Hyperlipidemia  Diabetes mellitus  Palliative care encounter  **Goals of care defined this admission:  DNR/DNI, son would consider BiPap.  Difficulty walking  Foot pain  H/O: CVA (cerebrovascular accident)  Allergies Allergies  Allergen Reactions  . Celebrex (Celecoxib) Other (See Comments)    Per [facility] MAR  . Lyrica (Pregabalin) Other (See Comments)    Per [facility] MAR   Procedures  None  History of Present Illness  77 y/o female with the above complex problem list.  She was in her USOH until the early morning hours on the day of admission when she was found by nursing home staff to be tachypneic, ashen, and tachycardic with oxygen saturations of 86% on room air.  [facility] was [facility] ED via EMS where she was found to be in afib with RVR.  Her troponin was mildly elevated by point-of-care evaluation @ 0.35.  CXR showed mild edema.  Pt was placed on heparin and diltiazem infusion and also treated with IV lasix.  Rates improved on diltiazem and she was admitted for further evaluation.  Hospital Course  Subsequent troponins were normal.  She was placed on oral lasix therapy with excellent response.  Lasix dose was decreased to 20mg  daily on 11/18 and for the course of this admission, she has had a net negative I/O of 13.7 Liters with reduction in weight from 198 Lbs on  admission to 175 Lbs today.  With diuresis, she has had significant clinical improvement.  From an afib standpoint, diltiazem was discontinued given known cardiomyopathy and her oral beta blocker dose and digoxin dose were increased.  Digoxin level on 0.25mg  daily returned @ 1.0, and this will be need to be followed-up as an outpatient.  Her beta-blocker was transitioned to Toprol XL, given cardiomyopathy, and rates have been reasonable in the 90 to low 100 range.  She remains in atrial fibrillation but is now asymptomatic.  She is not felt to be a coumadin candidate secondary to a h/o falls.  Finally, pt has baseline dementia and debility.  Given co-morbidities, palliative care was consulted and goals for care were outlined.  Pt was made a DNR/DNI and expressed a wish to avoid aggressive interventions such as ICD placement.  She will be discharged back to SNF today in stable condition.  Discharge Vitals Blood pressure 138/61, pulse 62, temperature 97.8 F (36.6 C), temperature source Oral, resp. rate 18, height 5\' 7"  (1.702 m), weight 175 lb 4.8 oz (79.516 kg), SpO2 95.00%.  Filed Weights   01/04/12 2151 01/05/12 2205 01/06/12 2257  Weight: 196 lb 3.4 oz (89 kg) 182 lb (82.555 kg) 175 lb 4.8 oz (79.516 kg)   Labs  CBC Lab Results  Component Value Date   WBC 9.7 01/03/2012   HGB 15.7* 01/03/2012   HCT 45.9 01/03/2012   MCV 84.2 01/03/2012   PLT 252 01/03/2012   Basic Metabolic Panel  Basename 01/06/12 0600  01/05/12 1015  NA 135 133*  K 3.5 3.4*  CL 100 96  CO2 27 29  GLUCOSE 108* 205*  BUN 14 13  CREATININE 0.70 0.79  CALCIUM 9.1 9.1  MG -- --  PHOS -- --   Liver Function Tests Lab Results  Component Value Date   ALT 23 01/02/2012   AST 32 01/02/2012   ALKPHOS 90 01/02/2012   BILITOT 1.3* 01/02/2012   Thyroid Function Tests Lab Results  Component Value Date   TSH 1.569 01/02/2012    Disposition  Pt is being discharged home today in good condition.  Follow-up  Plans & Appointments  Follow-up Information    Follow up with Tereso Newcomer, PA. On 01/21/2012. (2:40 PM)    Contact information:   [address] [place] [address] [phone]        Discharge Medications    Medication List     As of 01/07/2012  9:47 AM    STOP taking these medications         diltiazem 30 MG tablet   Commonly known as: CARDIZEM      metoprolol 50 MG tablet   Commonly known as: LOPRESSOR      TAKE these medications         aspirin 325 MG EC tablet   Take 325 mg by mouth daily.      atorvastatin 20 MG tablet   Commonly known as: LIPITOR   Take 20 mg by mouth daily at 8 pm.      digoxin 0.25 MG tablet   Commonly known as: LANOXIN   Take 1 tablet (0.25 mg total) by mouth daily.      esomeprazole 40 MG capsule   Commonly known as: NEXIUM   Take 40 mg by mouth daily before breakfast.      furosemide 20 MG tablet   Commonly known as: LASIX   Take 1 tablet (20 mg total) by mouth every morning.      insulin glargine 100 UNIT/ML injection   Commonly known as: LANTUS   Inject 44 Units into the skin daily at 8 pm.      lisinopril 2.5 MG tablet   Commonly known as: PRINIVIL,ZESTRIL   Take 1 tablet (2.5 mg total) by mouth daily.      metoprolol succinate 25 MG 24 hr tablet   Commonly known as: TOPROL-XL   Take 3 tablets (75 mg total) by mouth 2 (two) times daily.      nitroGLYCERIN 0.4 MG SL tablet   Commonly known as: NITROSTAT   Place 0.4 mg under the tongue every 5 (five) minutes as needed. Chest pain      oxyCODONE 15 MG immediate release tablet   Commonly known as: ROXICODONE   Take 15 mg by mouth every 8 (eight) hours as needed. pain      potassium chloride SA 20 MEQ tablet   Commonly known as: K-DUR,KLOR-CON   Take 1 tablet (20 mEq total) by mouth daily.      rOPINIRole 2 MG tablet   Commonly known as: REQUIP   Take 2 mg by mouth at bedtime.      Outstanding Labs/Studies  Digoxin level upon outpatient  F/U.  Duration of Discharge Encounter   Greater than 30 minutes including physician time.  Signed, Nicolasa Ducking NP 01/07/2012, 9:47 AM   Jesse Sans. Daleen Squibb, MD, [facility]  [facility] Pager:  [phone]

## 2012-01-21 ENCOUNTER — Encounter: Payer: Medicare Other | Admitting: Physician Assistant

## 2014-02-19 ENCOUNTER — Other Ambulatory Visit: Payer: Self-pay | Admitting: Dermatology

## 2014-07-23 IMAGING — CR DG CHEST 1V PORT SAME DAY
1 series · 1 of 1 positions shown · non-contrast
Comparison: 01/02/2012.

CLINICAL DATA: 76-year-old female congestive heart failure.

PORTABLE CHEST - 1 VIEW SAME DAY

[AP]
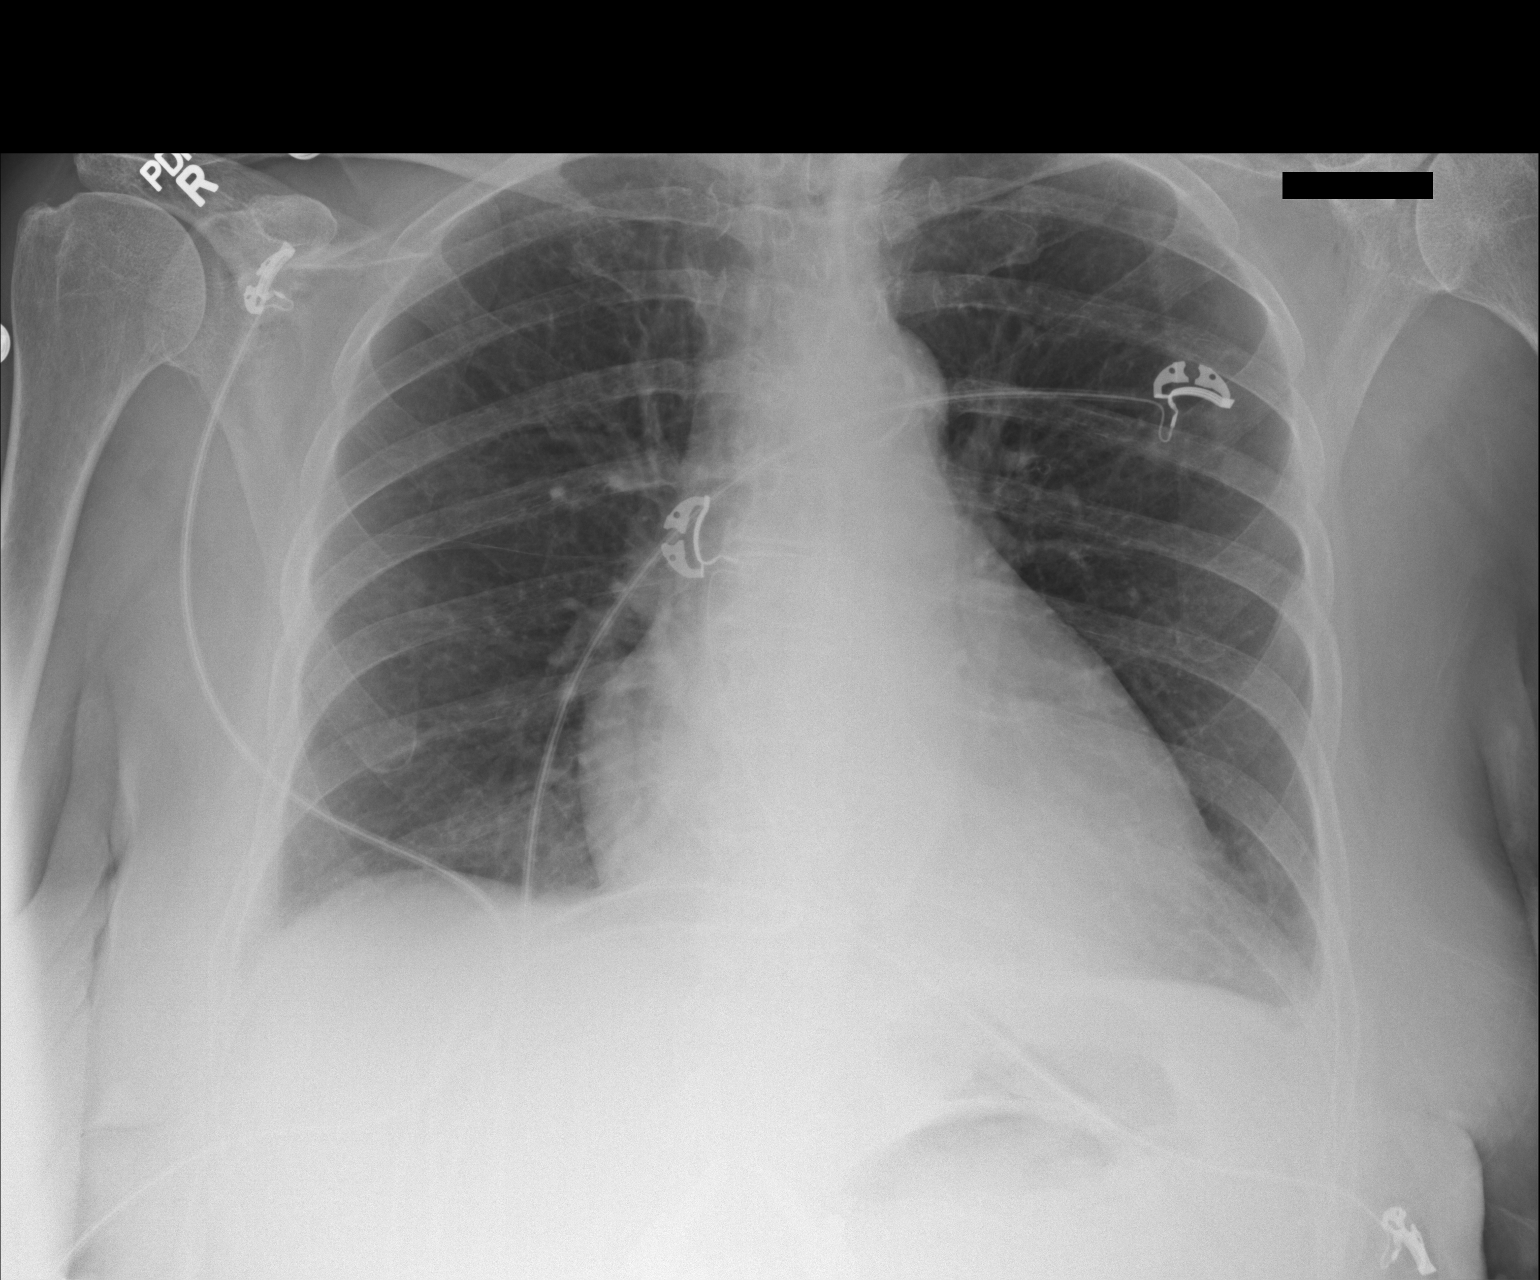

[1 of 1 positions shown; findings below may reference images not displayed]

FINDINGS: Portable AP seated upright view 3113 hours.  Small
bilateral pleural effusions appear decreased with improved
bibasilar ventilation. Stable cardiomegaly and mediastinal
contours.  No pneumothorax or edema.
IMPRESSION: Decreased small bilateral pleural effusions and improved bibasilar
ventilation.  No acute edema.

## 2018-03-07 ENCOUNTER — Other Ambulatory Visit: Payer: Self-pay | Admitting: Dermatology

## 2018-09-09 ENCOUNTER — Encounter: Payer: Self-pay | Admitting: Internal Medicine

## 2018-09-09 ENCOUNTER — Non-Acute Institutional Stay: Payer: Medicare Other | Admitting: Internal Medicine

## 2018-09-09 ENCOUNTER — Other Ambulatory Visit: Payer: Self-pay

## 2018-09-09 VITALS — Ht 68.0 in | Wt 167.0 lb

## 2018-09-09 DIAGNOSIS — Z515 Encounter for palliative care: Secondary | ICD-10-CM

## 2018-09-09 NOTE — Progress Notes (Signed)
July 24th, 2020 [facility] Palliative Care Consult Note Telephone: [phone]  Fax: [phone]  *Due to the current COVID-19 infection/crises, the patient and family prefer, and have given their verbal consent for, a provider visit via telehealth, from my office. HIPPA policies of confidentially were discussed.  PATIENT NAME: Jasmine Mcgee DOB: 01/22/1935 MRN: [id number]  Ritta Slot 709-B (admitted 01/01/2012; readmit 01/07/2012)  PRIMARY CARE PROVIDER: Teah Does-Johnson/ Dr. Seward Carol   REFERRING PROVIDER: Teah Does-Johnson/ Dr. Seward Carol  RESPONSIBLE PARTY: (son) Adaeze Better [phone].   ASSESSMENT / RECOMMENDATIONS:  1.Cognitive / Functional decline: Patient appears constantly confused. Her speech is limited to one or two words, usually off topic.  She is dependent for transfers, hygiene and dressing. She is unable to position herself in bed. Staff report adequate oral intake. Her current weight (08/26/18) is 167 lbs, which is a loss of 5.8 lbs over the last 7 months. At a height of 5'8" her BMI is 25.4 kg/m2. She is able to feed herself with assist. She has Med Pass 90mg  bid for nutritional supplementation. She is currently being treated with antibiotics for an abdominal abscess (warm compresses and antibiotics).  2. Family Supports: I left a message for patient's son Marya Amsler, with my contact information, requesting a call back.  3. Advanced Care Directive: Staff report in facility DNR in place Family has expressed desire for comfort measures only.  4. Goals of Care: Facility social worker reports that patient's family is interested in pursuing hospice eligibility.  5. Follow up Palliative Care Visit: Pending follow up with family.  I spent 60 minutes providing this consultation from 10am to 11am. More than 50% of the time in this consultation was spent coordinating communication.   HISTORY OF PRESENT ILLNESS:  Jasmine Mcgee  is a 84 y.o. 84 year old female with h/o atrial fibrillation, dementia, DM 2, HLD, HTN, COPD, acute on chronic syst/diastolic HF, CAD, and falls. Palliative Care was asked to help address advance care planning discussions, and determine goals of care for patient and family  CODE STATUS: DNR  PPS: 30%  HOSPICE ELIGIBILITY/DIAGNOSIS: TBD PAST MEDICAL HISTORY:  Past Medical History:  Diagnosis Date  . A-fib ([place])    a. Relatively rate controlled. Not felt to be an anticoagulation candidate 2/2 h/o falls.  . Chronic systolic CHF (congestive heart failure) ([place])    a. Fall 2013, [facility]: EF 20-25%, glob HK, PASP 30-35 mmHG, mildly reduced RV fxn, mod LAE, mild MR/TR.  Marland Kitchen COPD (chronic obstructive pulmonary disease) ([place])   . Dementia ([place])   . Diabetes mellitus without complication ([place])   . History of stroke   . Hyperlipidemia   . Hypertension     SOCIAL HX:  Social History   Tobacco Use  . Smoking status: Current Some Day Smoker    Packs/day: 1.00    Types: Cigarettes    Start date: 01/04/1955  Substance Use Topics  . Alcohol use: No    ALLERGIES:  Allergies  Allergen Reactions  . Celebrex [Celecoxib] Other (See Comments)    Per [facility]  . Lyrica [Pregabalin] Other (See Comments)    Per [facility]     PERTINENT MEDICATIONS:  Outpatient Encounter Medications as of 09/09/2018  Medication Sig  . acetaminophen (TYLENOL) 500 MG tablet Take 500 mg by mouth 3 (three) times daily.  Marland Kitchen aspirin 325 MG EC tablet Take 325 mg by mouth daily.  . bisacodyl (DULCOLAX) 10 MG suppository Place  10 mg rectally daily as needed for moderate constipation.  . carboxymethylcellulose 1 % ophthalmic solution 1 drop QID. And one drop Left eye q 2 hrs while awake  . digoxin (LANOXIN) 0.25 MG tablet Take 1 tablet (0.25 mg total) by mouth daily. (Patient taking differently: Take 0.125 mg by mouth daily. Hold apical pulse less than 60)  . Dulaglutide (TRULICITY) 0.75 MG/0.5ML SOPN Inject 0.75 mg into  the skin once a week. q week on wednesdays  . furosemide (LASIX) 20 MG tablet Take 1 tablet (20 mg total) by mouth every morning. (Patient taking differently: Take 40 mg by mouth every morning. )  . insulin glargine (LANTUS) 100 UNIT/ML injection Inject 44 Units into the skin daily at 8 pm. (Patient taking differently: Inject 100 Units into the skin daily. )  . lactulose (CHRONULAC) 10 GM/15ML solution Take 10 g by mouth every 3 (three) days. Also daily prn  . metoprolol succinate (TOPROL-XL) 25 MG 24 hr tablet Take 3 tablets (75 mg total) by mouth 2 (two) times daily.  . nitroGLYCERIN (NITROSTAT) 0.4 MG SL tablet Place 0.4 mg under the tongue every 5 (five) minutes as needed. Chest pain  . ondansetron (ZOFRAN) 4 MG tablet Take 4 mg by mouth every 8 (eight) hours as needed for nausea or vomiting.  . polyethylene glycol (MIRALAX / GLYCOLAX) 17 g packet Take 17 g by mouth daily.  Marland Kitchen. POLYVINYL ALCOHOL-POVIDONE PF OP Apply to eye. Apply a thin ribbon to Lft eye every night  . potassium chloride SA (K-DUR,KLOR-CON) 20 MEQ tablet Take 1 tablet (20 mEq total) by mouth daily.  . pravastatin (PRAVACHOL) 80 MG tablet Take 80 mg by mouth daily.  Marland Kitchen. rOPINIRole (REQUIP) 2 MG tablet Take 2 mg by mouth at bedtime.  . sulfamethoxazole-trimethoprim (BACTRIM DS) 800-160 MG tablet Take 1 tablet by mouth 2 (two) times daily. For 10 days  (stop 09/17/2018)  . traMADol (ULTRAM) 50 MG tablet Take 50 mg by mouth every 8 (eight) hours as needed.  . [DISCONTINUED] atorvastatin (LIPITOR) 20 MG tablet Take 20 mg by mouth daily at 8 pm.  . [DISCONTINUED] esomeprazole (NEXIUM) 40 MG capsule Take 40 mg by mouth daily before breakfast.  . [DISCONTINUED] lisinopril (PRINIVIL,ZESTRIL) 2.5 MG tablet Take 1 tablet (2.5 mg total) by mouth daily.  . [DISCONTINUED] oxyCODONE (ROXICODONE) 15 MG immediate release tablet Take 15 mg by mouth every 8 (eight) hours as needed. pain   No facility-administered encounter medications on file as of  09/09/2018.     PHYSICAL EXAM:   Limited d/t telehealth nature of visit. Well nourished, elderly female lying on her back in bed. She engages eye contact, and with encouragement is able to say a few words and smile.  Anselm LisMary P Tashay Bozich, NP

## 2021-03-19 DEATH — deceased
